# Patient Record
Sex: Male | Born: 1960 | Hispanic: Yes | Marital: Single | Smoking: Never smoker
Health system: Southern US, Community
[De-identification: ages and names within clinical notes are randomized; demographics above are authoritative.]

## PROBLEM LIST (undated history)

## (undated) DIAGNOSIS — F329 Major depressive disorder, single episode, unspecified: Secondary | ICD-10-CM

## (undated) DIAGNOSIS — R51 Headache: Secondary | ICD-10-CM

## (undated) DIAGNOSIS — F32A Depression, unspecified: Secondary | ICD-10-CM

## (undated) DIAGNOSIS — M1990 Unspecified osteoarthritis, unspecified site: Secondary | ICD-10-CM

## (undated) DIAGNOSIS — I10 Essential (primary) hypertension: Secondary | ICD-10-CM

## (undated) DIAGNOSIS — R519 Headache, unspecified: Secondary | ICD-10-CM

## (undated) DIAGNOSIS — R7303 Prediabetes: Secondary | ICD-10-CM

## (undated) HISTORY — PX: NO PAST SURGERIES: SHX2092

---

## 2017-04-17 ENCOUNTER — Emergency Department (HOSPITAL_COMMUNITY)
Admission: EM | Admit: 2017-04-17 | Discharge: 2017-04-17 | Disposition: A | Discharge status: Home / Self Care | Payer: Self-pay | Attending: Emergency Medicine | Admitting: Emergency Medicine

## 2017-04-17 ENCOUNTER — Encounter (HOSPITAL_COMMUNITY): Payer: Self-pay

## 2017-04-17 ENCOUNTER — Emergency Department (HOSPITAL_COMMUNITY): Payer: Self-pay

## 2017-04-17 DIAGNOSIS — M25562 Pain in left knee: Secondary | ICD-10-CM | POA: Insufficient documentation

## 2017-04-17 DIAGNOSIS — M1711 Unilateral primary osteoarthritis, right knee: Secondary | ICD-10-CM | POA: Insufficient documentation

## 2017-04-17 DIAGNOSIS — G8929 Other chronic pain: Secondary | ICD-10-CM | POA: Insufficient documentation

## 2017-04-17 DIAGNOSIS — M25561 Pain in right knee: Secondary | ICD-10-CM | POA: Insufficient documentation

## 2017-04-17 MED ORDER — DICLOFENAC SODIUM 1 % TD GEL: 4.0000 g | Freq: Four times a day (QID) | TRANSDERMAL | 0 refills | Status: DC

## 2017-04-17 MED ORDER — IBUPROFEN 600 MG PO TABS: 600.0000 mg | ORAL_TABLET | Freq: Four times a day (QID) | ORAL | 0 refills | Status: DC | PRN

## 2017-04-17 MED ORDER — IBUPROFEN 400 MG PO TABS
600.0000 mg | ORAL_TABLET | Freq: Once | ORAL | Status: AC
  Administered 2017-04-17: 14:00:00 600 mg via ORAL
  Filled 2017-04-17: qty 1

## 2017-04-17 NOTE — ED Notes (Signed)
Called pt name x3 to be roomed. No response. 

## 2017-04-17 NOTE — ED Provider Notes (Signed)
[facility]-EMERGENCY DEPT Provider Note   CSN: [id number] Arrival date & time: 04/17/17  0848     History   Chief Complaint No chief complaint on file.   HPI Nathaniel Bailey is a 56 y.o. male.  The history is provided by the patient.  Knee Pain   The current episode started more than 1 week ago. The problem occurs constantly. The problem has been gradually worsening. The pain is present in the right knee. The quality of the pain is described as aching and sharp. The pain is moderate. Associated symptoms include limited range of motion and stiffness. Pertinent negatives include no numbness. The symptoms are aggravated by activity. He has tried OTC pain medications for the symptoms. The treatment provided moderate relief. There has been no history of extremity trauma.   56 year old male who presents with bilateral knee pain and lower neck pain x 3 years. Over past few weeks, right knee pain has been worse and primarily the reason he comes to ED for evaluation. He has intermittent swelling, but no redness or fever. Pain worse with walking and ROM of the knee. No fall or injury.    History reviewed. No pertinent past medical history.  There are no active problems to display for this patient.   History reviewed. No pertinent surgical history.     Home Medications    Prior to Admission medications   Medication Sig Start Date End Date Taking? Authorizing Provider  diclofenac sodium (VOLTAREN) 1 % GEL Apply 4 g topically 4 (four) times daily. 04/17/17   Lavera Guise, MD  ibuprofen (ADVIL,MOTRIN) 600 MG tablet Take 1 tablet (600 mg total) by mouth every 6 (six) hours as needed for mild pain or moderate pain. 04/17/17   Lavera Guise, MD    Family History No family history on file.  Social History Social History  Substance Use Topics  . Smoking status: Never Smoker  . Smokeless tobacco: Never Used  . Alcohol use Not on file     Allergies   Patient has no allergy  information on record.   Review of Systems Review of Systems  Constitutional: Negative for fever.  Gastrointestinal: Negative for nausea and vomiting.  Musculoskeletal: Positive for arthralgias and stiffness.  Skin: Negative for wound.  Allergic/Immunologic: Negative for immunocompromised state.  Neurological: Negative for numbness.  Hematological: Does not bruise/bleed easily.     Physical Exam Updated Vital Signs BP (!) 162/89   Pulse 75   Temp 98 F (36.7 C) (Oral)   Resp 18   SpO2 100%   Physical Exam Physical Exam  Constitutional: Appears well-developed and well-nourished. No acute distress. HENT:  Head: Normocephalic.  Eyes: Conjunctivae are normal.  Cardiovascular: Normal rate and intact distal pulses.   +2 DP pulses bilaterally Pulmonary/Chest: Effort normal. No respiratory distress.  Abdominal: Exhibits no distension. no tenderness. Musculoskeletal: Normal range of motion of bilateral knees. No soft tissue swelling, bruising or deformity. Neurological: Alert. Fluent speech. Full strength in bilateral lower extremities. Full sensation in bilateral lower extremities Skin: Skin is warm and dry.  Psychiatric: Normal mood and affect. Behavior is normal.  Nursing note and vitals reviewed.   ED Treatments / Results  Labs (all labs ordered are listed, but only abnormal results are displayed) Labs Reviewed - No data to display  EKG  EKG Interpretation None       Radiology Dg Knee Complete 4 Views Right  Result Date: 04/17/2017 CLINICAL DATA:  Worsening knee pain for  a few months. EXAM: RIGHT KNEE - COMPLETE 4+ VIEW COMPARISON:  None. FINDINGS: No evidence of fracture, dislocation, or joint effusion. Degenerative marginal spurring greatest at the medial compartment. IMPRESSION: 1. Mild degenerative spurring greatest at the medial compartment. 2. No acute or focal finding. Electronically Signed   By: Marnee Spring M.D.   On: 04/17/2017 11:54     Procedures Procedures (including critical care time)  Medications Ordered in ED Medications  ibuprofen (ADVIL,MOTRIN) tablet 600 mg (not administered)     Initial Impression / Assessment and Plan / ED Course  I have reviewed the triage vital signs and the nursing notes.  Pertinent labs & imaging results that were available during my care of the patient were reviewed by me and considered in my medical decision making (see chart for details).     Primarily presents with 3 years now worsening right knee pain and intermittent swelling. Presentation seems consistent with that of likely arthritis. X-ray of the right knee shows evidence of mild arthritis with bone spur. I discussed continued supportive care with anti-inflammatory medications. He requested referral for knee injections, and given contact or orthopedic surgery. Strict return and follow-up instructions reviewed. He expressed understanding of all discharge instructions and felt comfortable with the plan of care.   Final Clinical Impressions(s) / ED Diagnoses   Final diagnoses:  Chronic pain of both knees  Arthritis of right knee    New Prescriptions New Prescriptions   DICLOFENAC SODIUM (VOLTAREN) 1 % GEL    Apply 4 g topically 4 (four) times daily.   IBUPROFEN (ADVIL,MOTRIN) 600 MG TABLET    Take 1 tablet (600 mg total) by mouth every 6 (six) hours as needed for mild pain or moderate pain.     Lavera Guise, MD 04/17/17 [phone]

## 2017-04-17 NOTE — ED Notes (Signed)
Patient transported to X-ray 

## 2017-04-17 NOTE — ED Triage Notes (Signed)
Patient complains of ongoing bilateral knee and posterior neck pain for months, denies trauma. Some relief with advil. NAD

## 2017-04-17 NOTE — Discharge Instructions (Signed)
Please follow-up with orthopedic doctor regarding your arthritis if needed  Take ibuprofen, tylenol and use cream as prescribed for pain  Return for worsening symptoms, including fever, worsening redness/swelling, or any other symptoms concerning to you

## 2017-05-22 ENCOUNTER — Ambulatory Visit (INDEPENDENT_AMBULATORY_CARE_PROVIDER_SITE_OTHER): Payer: Self-pay | Admitting: Physician Assistant

## 2017-05-29 ENCOUNTER — Ambulatory Visit (INDEPENDENT_AMBULATORY_CARE_PROVIDER_SITE_OTHER): Payer: Self-pay | Admitting: Physician Assistant

## 2017-05-29 ENCOUNTER — Encounter (INDEPENDENT_AMBULATORY_CARE_PROVIDER_SITE_OTHER): Payer: Self-pay | Admitting: Physician Assistant

## 2017-05-29 DIAGNOSIS — M25561 Pain in right knee: Secondary | ICD-10-CM

## 2017-05-29 DIAGNOSIS — M722 Plantar fascial fibromatosis: Secondary | ICD-10-CM

## 2017-05-29 MED ORDER — IBUPROFEN 600 MG PO TABS
600.0000 mg | ORAL_TABLET | Freq: Four times a day (QID) | ORAL | 0 refills | Status: DC | PRN
Start: 2017-05-29 — End: 2021-09-29

## 2017-05-29 NOTE — Progress Notes (Signed)
Office Visit Note   Patient: Nathaniel Bailey           Date of Birth: 1961/05/03           MRN: [id number] Visit Date: 05/29/2017              Requested by: No referring provider defined for this encounter. PCP: Patient, No Pcp Per   Assessment & Plan: Visit Diagnoses:  1. Right knee pain, unspecified chronicity   2. Plantar fasciitis, right     Plan: Discussed with him proper shoe wear.  Discussed gastrocsoleus stretching exercises reviewed this with him.  Have him continue the ibuprofen 600 mg 3 times daily.  In regards to the knee seeing back an approximately 2 weeks to check his progress or lack of.  Like for him to make note of any sensation of the knee locking giving way or other mechanical symptoms which are reviewed with him today also how much relief he received from the injection.  In regards to the bruising on the medial aspect of the left ankle discussed with him that this appeared to be just some broken blood vessels and that it is nothing to be concerned about.   Follow-Up Instructions: Return in about 4 weeks (around 06/26/2017).   Orders:  No orders of the defined types were placed in this encounter.  Meds ordered this encounter  Medications  . ibuprofen (ADVIL,MOTRIN) 600 MG tablet    Sig: Take 1 tablet (600 mg total) by mouth every 6 (six) hours as needed for mild pain or moderate pain.    Dispense:  30 tablet    Refill:  0      Procedures: No procedures performed   Clinical Data: No additional findings.   Subjective: Chief Complaint  Patient presents with  . Left Knee - Pain  . Right Knee - Pain  Right heel pain  HPI [name] is a 56 year old Spanish-speaking male who is accompanied by an interpreter today.  Comes in today for bilateral knee pain he states that he had left knee pain 1-2 years ago currently having no pain after having an injection approximately 2 years ago.  He notes that he has  an area of bruising medial aspect of  the left ankle he is concerned about.  He has had no injury.  He is having right knee pain this been ongoing for the past 5 years but has became very painful over the last few months.  He notes that if he flexes his knee way back he has a locking sensation in the knee when further questioned about this there is some concern that there may be a language barrier here despite the interpreter going over what walking truly means he may just feel stiffness after bringing the leg but back to far and when rising from a sitting position.  He states he has been told that his cartilage was gone in his right knee 5 years ago.  He has had no known injury to the knee.  Is also having right heel pain that is worse with first step of the day and at the end of the day after working in a splint about the past 4-5 months.  He has had no injury to the right heel. Personally reviewed radiographs from 04/17/2017 of the right knee and this showed some mild medial compartmental narrowing.  Otherwise no acute fractures.  No effusion.  Knee is well located.  Review of Systems Please see HPI  otherwise review of systems negative  Objective: Vital Signs: There were no vitals taken for this visit.  Physical Exam  Constitutional: He is oriented to person, place, and time. He appears well-developed and well-nourished. No distress.  Cardiovascular: Intact distal pulses.  Pulmonary/Chest: Effort normal.  Neurological: He is alert and oriented to person, place, and time.  Skin: He is not diaphoretic.  Psychiatric: He has a normal mood and affect.    Ortho Exam Bilateral feet good sensation throughout.  No rashes skin lesions ulcerations or impending ulcers.  Dorsal pedal pulses are present bilaterally.  Nontender over the Achilles bilaterally.  Tenderness over the right medial tubercle of the calcaneus.  Tight gastroc on the right nontolerant on the left.  Calves are supple nontender bilaterally.  Left knee full range of motion  without pain.  No instability valgus varus stressing.  Negative McMurray's.  Bilateral knees without effusion, edema or erythema.  Right knee tenderness along the medial lateral joint line.  He has excellent range of motion right knee but with forced flexion he has pain in the knee.  McMurray's is negative bilaterally Specialty Comments:  No specialty comments available.  Imaging: No results found.   PMFS History: There are no active problems to display for this patient.  History reviewed. No pertinent past medical history.  History reviewed. No pertinent family history.  History reviewed. No pertinent surgical history. Social History   Occupational History  . Not on file  Tobacco Use  . Smoking status: Never Smoker  . Smokeless tobacco: Never Used  Substance and Sexual Activity  . Alcohol use: Not on file  . Drug use: Not on file  . Sexual activity: Not on file

## 2017-05-29 NOTE — Progress Notes (Deleted)
Nathaniel Bailey returns today for follow-up of her left knee status post left knee arthroscopy with partial medial meniscectomy and a large bucket-handle tear.  She is having pain in the back of the knee and the anterior aspect of the knee also.  States pain is worse with weightbearing.  Said no fevers chills no shortness of breath chest pain calf pain.  An interpreter she used to speak with patient due to the fact that she only speaks [place].  Left knee: Port sites are healing well no signs of infection left calf supple nontender.  She has full extension and flexion beyond 90 degrees.  There is no abnormal warmth,but she has a slight effusion.    Impression: 1 week status post left knee arthroscopy with medial meniscectomy of a large bucket-handle tear  Plan: Discussed with patient that her knee pain should dissipate with time but due to the fact that she is having significant pain recommended aspiration of the knee and injection with cortisone.  She agrees.  We will see her back in 4 weeks sooner if there is any concerns or questions.  Sutures were removed.  She can wash in the shower and get these of the incisions wet.  She will work on scar tissue mobilization.

## 2017-05-29 NOTE — Progress Notes (Deleted)
   Procedure Note  Patient: Nathaniel Bailey             Date of Birth: [birth year]           MRN: [id number]             Visit Date: 05/29/2017  Procedures: Visit Diagnoses: No diagnosis found.  Large Joint Inj: L knee on 05/29/2017 2:32 PM Indications: pain Details: 22 G 1.5 in needle, superolateral approach  Arthrogram: No  Medications: 40 mg methylPREDNISolone acetate 40 MG/ML; 5 mL lidocaine 1 % Aspirate: 20 mL Outcome: tolerated well, no immediate complications Procedure, treatment alternatives, risks and benefits explained, specific risks discussed. Consent was given by the patient. Immediately prior to procedure a time out was called to verify the correct patient, procedure, equipment, support staff and site/side marked as required. Patient was prepped and draped in the usual sterile fashion.

## 2017-06-13 ENCOUNTER — Ambulatory Visit (INDEPENDENT_AMBULATORY_CARE_PROVIDER_SITE_OTHER): Payer: Self-pay | Admitting: Physician Assistant

## 2017-06-13 ENCOUNTER — Other Ambulatory Visit (INDEPENDENT_AMBULATORY_CARE_PROVIDER_SITE_OTHER): Payer: Self-pay

## 2017-06-13 ENCOUNTER — Encounter (INDEPENDENT_AMBULATORY_CARE_PROVIDER_SITE_OTHER): Payer: Self-pay | Admitting: Physician Assistant

## 2017-06-13 DIAGNOSIS — M25561 Pain in right knee: Secondary | ICD-10-CM

## 2017-06-13 DIAGNOSIS — G8929 Other chronic pain: Secondary | ICD-10-CM

## 2017-06-13 MED ORDER — DICLOFENAC SODIUM 75 MG PO TBEC: 75.0000 mg | DELAYED_RELEASE_TABLET | Freq: Two times a day (BID) | ORAL | 1 refills | Status: DC

## 2017-06-13 NOTE — Progress Notes (Signed)
[name] returns today follow-up of his right knee pain and right plantar fasciitis.  He states that his knee felt better initially after the injection.  However his pain is now returning in the medial aspect of the knee.  He was taking ibuprofen for the pain which helps some but he ran out of the ibuprofen 2 days ago.  In regards to his plantar fasciitis this is much improved with the stretching and anti-inflammatories.  Again he is Spanish speaking and an interpreter accompanies him today.  Physical exam: Right knee has tenderness along medial joint line.  Good range of motion of the knee today.  Slight effusion.  Positive McMurray's.  Impression: Right knee pain  Right plantar fasciitis resolved  Plan: Due to patient's continued pain in the right knee despite time and a cortisone injection recommend MRI to rule out medial meniscal tear.  Have him follow-up after the MRI to go over results and discuss further treatment.  Placed him back on his diclofenac.  Questions were encouraged and answered today at length using the interpreter that was present throughout the entire exam.

## 2017-07-08 ENCOUNTER — Ambulatory Visit
Admission: RE | Admit: 2017-07-08 | Discharge: 2017-07-08 | Disposition: A | Discharge status: Home / Self Care | Payer: No Typology Code available for payment source | Source: Ambulatory Visit | Attending: Physician Assistant | Admitting: Physician Assistant

## 2017-07-08 DIAGNOSIS — M25561 Pain in right knee: Principal | ICD-10-CM

## 2017-07-08 DIAGNOSIS — G8929 Other chronic pain: Secondary | ICD-10-CM

## 2017-09-16 ENCOUNTER — Ambulatory Visit (INDEPENDENT_AMBULATORY_CARE_PROVIDER_SITE_OTHER): Payer: Self-pay | Admitting: Physician Assistant

## 2017-10-07 ENCOUNTER — Encounter (INDEPENDENT_AMBULATORY_CARE_PROVIDER_SITE_OTHER): Payer: Self-pay | Admitting: Physician Assistant

## 2017-10-07 ENCOUNTER — Ambulatory Visit (INDEPENDENT_AMBULATORY_CARE_PROVIDER_SITE_OTHER): Payer: Self-pay | Admitting: Physician Assistant

## 2017-10-07 VITALS — Ht 66.0 in | Wt 165.0 lb

## 2017-10-07 DIAGNOSIS — M23200 Derangement of unspecified lateral meniscus due to old tear or injury, right knee: Secondary | ICD-10-CM

## 2017-10-07 NOTE — Progress Notes (Signed)
   Office Visit Note   Patient: Nathaniel Bailey           Date of Birth: 1960/08/17           MRN: [id number] Visit Date: 10/07/2017              Requested by: No referring provider defined for this encounter. PCP: Patient, No Pcp Per   Assessment & Plan: Visit Diagnoses:  1. Other old tear of lateral meniscus of right knee     Plan: Due to the MRI findings and the fact that the patient continues to have pain and mechanical symptoms of the right knee recommend right knee arthroscopy with partial medial meniscectomy.  Also possible excision of ganglion cyst.  Questions encouraged and answered at length today.  Risk including infection PE DVT all reviewed with the patient.  We will see him back 1 week postop.  Did discuss the knee arthroscopy with the patient at length today using an interpreter to help with communication.  Patient's questions were encouraged and answered by Dr. Magnus IvanBlackman and myself today.  Follow-Up Instructions: Return in about 1 week (around 10/14/2017) for post-op.   Orders:  No orders of the defined types were placed in this encounter.  No orders of the defined types were placed in this encounter.     Procedures: No procedures performed   Clinical Data: No additional findings.   Subjective: Chief Complaint  Patient presents with  . Right Knee - Pain, Follow-up    MRI review    HPI Nathaniel Bailey returns today for follow-up of his right knee status post MRI.  He continues to have pain in medial aspect of the right knee and giving way sensation of the knee.  No catching locking popping.  He underwent an MRI dated 07/08/2017.  This shows a complex tear involving the medial meniscus at the mid body region.  Also a 2.2 x 1.8 ganglion cyst is seen in the central portion of the joint space.Medial compartmental moderate chondromalacia .   Review of Systems See HPI otherwise negative  Objective: Vital Signs: Ht 5\' 6"  (1.676 m)   Wt 165 lb (74.8  kg)   BMI 26.63 kg/m   Physical Exam  Constitutional: He is oriented to person, place, and time. He appears well-developed and well-nourished. No distress.  Pulmonary/Chest: Effort normal.  Neurological: He is alert and oriented to person, place, and time.  Skin: He is not diaphoretic.  Psychiatric: He has a normal mood and affect.    Ortho Exam Right knee no effusion abnormal warmth erythema.  Tenderness along medial joint line.  No instability valgus varus stress. Specialty Comments:  No specialty comments available.  Imaging: No results found.   PMFS History: There are no active problems to display for this patient.  No past medical history on file.  No family history on file.  No past surgical history on file. Social History   Occupational History  . Not on file  Tobacco Use  . Smoking status: Never Smoker  . Smokeless tobacco: Never Used  Substance and Sexual Activity  . Alcohol use: Not on file  . Drug use: Not on file  . Sexual activity: Not on file

## 2017-10-09 ENCOUNTER — Other Ambulatory Visit (INDEPENDENT_AMBULATORY_CARE_PROVIDER_SITE_OTHER): Payer: Self-pay | Admitting: Physician Assistant

## 2017-10-16 ENCOUNTER — Other Ambulatory Visit: Payer: Self-pay

## 2017-10-16 ENCOUNTER — Encounter (HOSPITAL_COMMUNITY): Payer: Self-pay | Admitting: *Deleted

## 2017-10-17 ENCOUNTER — Ambulatory Visit (HOSPITAL_COMMUNITY): Payer: Self-pay | Admitting: Certified Registered Nurse Anesthetist

## 2017-10-17 ENCOUNTER — Encounter (HOSPITAL_COMMUNITY)
Admission: RE | Disposition: A | Discharge status: Home / Self Care | Payer: Self-pay | Source: Ambulatory Visit | Attending: Orthopaedic Surgery

## 2017-10-17 ENCOUNTER — Telehealth (INDEPENDENT_AMBULATORY_CARE_PROVIDER_SITE_OTHER): Payer: Self-pay | Admitting: Orthopaedic Surgery

## 2017-10-17 ENCOUNTER — Ambulatory Visit (HOSPITAL_COMMUNITY)
Admission: RE | Admit: 2017-10-17 | Discharge: 2017-10-17 | Disposition: A | Discharge status: Home / Self Care | Payer: Self-pay | Source: Ambulatory Visit | Attending: Orthopaedic Surgery | Admitting: Orthopaedic Surgery

## 2017-10-17 ENCOUNTER — Encounter (HOSPITAL_COMMUNITY): Payer: Self-pay | Admitting: Anesthesiology

## 2017-10-17 DIAGNOSIS — M94261 Chondromalacia, right knee: Secondary | ICD-10-CM | POA: Insufficient documentation

## 2017-10-17 DIAGNOSIS — Z79899 Other long term (current) drug therapy: Secondary | ICD-10-CM | POA: Insufficient documentation

## 2017-10-17 DIAGNOSIS — M23321 Other meniscus derangements, posterior horn of medial meniscus, right knee: Secondary | ICD-10-CM

## 2017-10-17 DIAGNOSIS — M23221 Derangement of posterior horn of medial meniscus due to old tear or injury, right knee: Secondary | ICD-10-CM | POA: Insufficient documentation

## 2017-10-17 DIAGNOSIS — F329 Major depressive disorder, single episode, unspecified: Secondary | ICD-10-CM | POA: Insufficient documentation

## 2017-10-17 DIAGNOSIS — M25461 Effusion, right knee: Secondary | ICD-10-CM | POA: Insufficient documentation

## 2017-10-17 DIAGNOSIS — M1990 Unspecified osteoarthritis, unspecified site: Secondary | ICD-10-CM | POA: Insufficient documentation

## 2017-10-17 HISTORY — DX: Depression, unspecified: F32.A

## 2017-10-17 HISTORY — DX: Major depressive disorder, single episode, unspecified: F32.9

## 2017-10-17 HISTORY — DX: Essential (primary) hypertension: I10

## 2017-10-17 HISTORY — DX: Prediabetes: R73.03

## 2017-10-17 HISTORY — DX: Headache: R51

## 2017-10-17 HISTORY — PX: KNEE ARTHROSCOPY WITH MEDIAL MENISECTOMY: SHX5651

## 2017-10-17 HISTORY — DX: Headache, unspecified: R51.9

## 2017-10-17 HISTORY — DX: Unspecified osteoarthritis, unspecified site: M19.90

## 2017-10-17 LAB — CBC
HCT: 43.7 % (ref 39.0–52.0)
Hemoglobin: 15 g/dL (ref 13.0–17.0)
MCH: 29.9 pg (ref 26.0–34.0)
MCHC: 34.3 g/dL (ref 30.0–36.0)
MCV: 87.1 fL (ref 78.0–100.0)
PLATELETS: 190 10*3/uL (ref 150–400)
RBC: 5.02 MIL/uL (ref 4.22–5.81)
RDW: 13.4 % (ref 11.5–15.5)
WBC: 5.9 10*3/uL (ref 4.0–10.5)

## 2017-10-17 LAB — BASIC METABOLIC PANEL
Anion gap: 10 (ref 5–15)
BUN: 12 mg/dL (ref 6–20)
CHLORIDE: 107 mmol/L (ref 101–111)
CO2: 24 mmol/L (ref 22–32)
CREATININE: 0.83 mg/dL (ref 0.61–1.24)
Calcium: 9.1 mg/dL (ref 8.9–10.3)
GFR calc non Af Amer: >60 mL/min (ref >60)
Glucose, Bld: 134 mg/dL — ABNORMAL HIGH (ref 65–99)
Potassium: 3.9 mmol/L (ref 3.5–5.1)
SODIUM: 141 mmol/L (ref 135–145)

## 2017-10-17 SURGERY — ARTHROSCOPY, KNEE, WITH MEDIAL MENISCECTOMY
Anesthesia: General | Site: Knee | Laterality: Right

## 2017-10-17 MED ORDER — HYDROCODONE-ACETAMINOPHEN 5-325 MG PO TABS: 1.0000 | ORAL_TABLET | ORAL | 0 refills | Status: DC | PRN

## 2017-10-17 MED ORDER — BUPIVACAINE HCL (PF) 0.5 % IJ SOLN
INTRAMUSCULAR | Status: AC
  Filled 2017-10-17: qty 30

## 2017-10-17 MED ORDER — LIDOCAINE 2% (20 MG/ML) 5 ML SYRINGE
INTRAMUSCULAR | Status: DC | PRN
  Administered 2017-10-17: 100 mg via INTRAVENOUS

## 2017-10-17 MED ORDER — CEFAZOLIN SODIUM-DEXTROSE 2-4 GM/100ML-% IV SOLN
2.0000 g | INTRAVENOUS | Status: AC
  Administered 2017-10-17: 2 g via INTRAVENOUS
  Filled 2017-10-17: qty 100

## 2017-10-17 MED ORDER — LIDOCAINE 2% (20 MG/ML) 5 ML SYRINGE
INTRAMUSCULAR | Status: AC
  Filled 2017-10-17: qty 5

## 2017-10-17 MED ORDER — HYDROCODONE-ACETAMINOPHEN 7.5-325 MG PO TABS
1.0000 | ORAL_TABLET | Freq: Once | ORAL | Status: AC | PRN
  Administered 2017-10-17: 1 via ORAL

## 2017-10-17 MED ORDER — CHLORHEXIDINE GLUCONATE 4 % EX LIQD: 60.0000 mL | Freq: Once | CUTANEOUS | Status: DC

## 2017-10-17 MED ORDER — PROPOFOL 10 MG/ML IV BOLUS
INTRAVENOUS | Status: AC
  Filled 2017-10-17: qty 20

## 2017-10-17 MED ORDER — FENTANYL CITRATE (PF) 250 MCG/5ML IJ SOLN
INTRAMUSCULAR | Status: AC
  Filled 2017-10-17: qty 5

## 2017-10-17 MED ORDER — MORPHINE SULFATE (PF) 4 MG/ML IV SOLN
INTRAVENOUS | Status: AC
  Filled 2017-10-17: qty 1

## 2017-10-17 MED ORDER — HYDROCODONE-ACETAMINOPHEN 7.5-325 MG PO TABS
ORAL_TABLET | ORAL | Status: AC
  Filled 2017-10-17: qty 1

## 2017-10-17 MED ORDER — HYDROMORPHONE HCL 1 MG/ML IJ SOLN: 0.2500 mg | INTRAMUSCULAR | Status: DC | PRN

## 2017-10-17 MED ORDER — BUPIVACAINE HCL (PF) 0.5 % IJ SOLN
INTRAMUSCULAR | Status: DC | PRN
  Administered 2017-10-17: 10 mL
  Administered 2017-10-17: 20 mL

## 2017-10-17 MED ORDER — MIDAZOLAM HCL 5 MG/5ML IJ SOLN
INTRAMUSCULAR | Status: DC | PRN
  Administered 2017-10-17 (×2): 1 mg via INTRAVENOUS

## 2017-10-17 MED ORDER — HYDROCODONE-ACETAMINOPHEN 5-325 MG PO TABS
ORAL_TABLET | ORAL | Status: AC
  Filled 2017-10-17: qty 1

## 2017-10-17 MED ORDER — PROPOFOL 10 MG/ML IV BOLUS
INTRAVENOUS | Status: DC | PRN
  Administered 2017-10-17: 200 mg via INTRAVENOUS

## 2017-10-17 MED ORDER — MORPHINE SULFATE (PF) 4 MG/ML IV SOLN
INTRAVENOUS | Status: DC | PRN
  Administered 2017-10-17: 4 mg

## 2017-10-17 MED ORDER — DEXAMETHASONE SODIUM PHOSPHATE 10 MG/ML IJ SOLN
INTRAMUSCULAR | Status: DC | PRN
  Administered 2017-10-17: 10 mg via INTRAVENOUS

## 2017-10-17 MED ORDER — DEXAMETHASONE SODIUM PHOSPHATE 10 MG/ML IJ SOLN
INTRAMUSCULAR | Status: AC
  Filled 2017-10-17: qty 1

## 2017-10-17 MED ORDER — MIDAZOLAM HCL 2 MG/2ML IJ SOLN
INTRAMUSCULAR | Status: AC
Start: 2017-10-17 — End: ?
  Filled 2017-10-17: qty 2

## 2017-10-17 MED ORDER — ONDANSETRON HCL 4 MG/2ML IJ SOLN
INTRAMUSCULAR | Status: AC
  Filled 2017-10-17: qty 2

## 2017-10-17 MED ORDER — STERILE WATER FOR IRRIGATION IR SOLN
Status: DC | PRN
  Administered 2017-10-17: 500 mL

## 2017-10-17 MED ORDER — ONDANSETRON HCL 4 MG/2ML IJ SOLN
INTRAMUSCULAR | Status: DC | PRN
  Administered 2017-10-17: 4 mg via INTRAVENOUS

## 2017-10-17 MED ORDER — LACTATED RINGERS IV SOLN
INTRAVENOUS | Status: DC
  Administered 2017-10-17: 1000 mL via INTRAVENOUS

## 2017-10-17 MED ORDER — FENTANYL CITRATE (PF) 100 MCG/2ML IJ SOLN
INTRAMUSCULAR | Status: DC | PRN
  Administered 2017-10-17 (×2): 50 ug via INTRAVENOUS

## 2017-10-17 MED ORDER — LACTATED RINGERS IR SOLN
Status: DC | PRN
  Administered 2017-10-17: 6000 mL

## 2017-10-17 MED ORDER — MEPERIDINE HCL 50 MG/ML IJ SOLN: 6.2500 mg | INTRAMUSCULAR | Status: DC | PRN

## 2017-10-17 MED ORDER — PROMETHAZINE HCL 25 MG/ML IJ SOLN: 6.2500 mg | INTRAMUSCULAR | Status: DC | PRN

## 2017-10-17 SURGICAL SUPPLY — 32 items
BANDAGE ACE 6X5 VEL STRL LF (GAUZE/BANDAGES/DRESSINGS) ×3 IMPLANT
BLADE CUDA SHAVER 3.5 (BLADE) ×3 IMPLANT
COVER SURGICAL LIGHT HANDLE (MISCELLANEOUS) ×3 IMPLANT
DRAPE U-SHAPE 47X51 STRL (DRAPES) ×3 IMPLANT
DURAPREP 26ML APPLICATOR (WOUND CARE) ×3 IMPLANT
GAUZE SPONGE 4X4 12PLY STRL (GAUZE/BANDAGES/DRESSINGS) ×3 IMPLANT
GAUZE XEROFORM 1X8 LF (GAUZE/BANDAGES/DRESSINGS) ×3 IMPLANT
GLOVE BIO SURGEON STRL SZ7.5 (GLOVE) ×3 IMPLANT
GLOVE BIOGEL PI IND STRL 6.5 (GLOVE) ×1 IMPLANT
GLOVE BIOGEL PI IND STRL 7.5 (GLOVE) ×1 IMPLANT
GLOVE BIOGEL PI IND STRL 8 (GLOVE) ×2 IMPLANT
GLOVE BIOGEL PI INDICATOR 6.5 (GLOVE) ×2
GLOVE BIOGEL PI INDICATOR 7.5 (GLOVE) ×2
GLOVE BIOGEL PI INDICATOR 8 (GLOVE) ×4
GLOVE ECLIPSE 7.5 STRL STRAW (GLOVE) ×3 IMPLANT
GLOVE ECLIPSE 8.0 STRL XLNG CF (GLOVE) ×3 IMPLANT
GLOVE SKINSENSE NS SZ7.5 (GLOVE) ×2
GLOVE SKINSENSE STRL SZ7.5 (GLOVE) ×1 IMPLANT
GLOVE SURG SS PI 6.5 STRL IVOR (GLOVE) ×3 IMPLANT
GOWN STRL REUS W/TWL LRG LVL3 (GOWN DISPOSABLE) ×3 IMPLANT
GOWN STRL REUS W/TWL XL LVL3 (GOWN DISPOSABLE) ×9 IMPLANT
KIT BASIN OR (CUSTOM PROCEDURE TRAY) ×3 IMPLANT
MANIFOLD NEPTUNE II (INSTRUMENTS) ×3 IMPLANT
PACK ARTHROSCOPY WL (CUSTOM PROCEDURE TRAY) ×3 IMPLANT
PAD ABD 8X10 STRL (GAUZE/BANDAGES/DRESSINGS) ×6 IMPLANT
PADDING CAST COTTON 6X4 STRL (CAST SUPPLIES) ×3 IMPLANT
POSITIONER SURGICAL ARM (MISCELLANEOUS) ×3 IMPLANT
SUT ETHILON 4 0 PS 2 18 (SUTURE) ×3 IMPLANT
SYR CONTROL 10ML LL (SYRINGE) ×3 IMPLANT
TOWEL OR 17X26 10 PK STRL BLUE (TOWEL DISPOSABLE) ×3 IMPLANT
TUBING ARTHRO INFLOW-ONLY STRL (TUBING) ×3 IMPLANT
WRAP KNEE MAXI GEL POST OP (GAUZE/BANDAGES/DRESSINGS) ×3 IMPLANT

## 2017-10-17 NOTE — Anesthesia Procedure Notes (Signed)
Procedure Name: LMA Insertion Date/Time: 10/17/2017 10:38 AM Performed by: Orest DikesPeters, Cheyanna Strick J, CRNA Pre-anesthesia Checklist: Patient identified, Emergency Drugs available, Suction available and Patient being monitored Patient Re-evaluated:Patient Re-evaluated prior to induction Oxygen Delivery Method: Circle system utilized Preoxygenation: Pre-oxygenation with 100% oxygen Induction Type: IV induction LMA: LMA inserted LMA Size: 5.0 Number of attempts: 1 Placement Confirmation: positive ETCO2 and breath sounds checked- equal and bilateral Tube secured with: Tape Dental Injury: Teeth and Oropharynx as per pre-operative assessment

## 2017-10-17 NOTE — H&P (Signed)
Nathaniel Bailey is an 57 y.o. male.   Chief Complaint:   Right knee pain with locking at catching HPI:   57 yo male with a history of chronic right knee pain with swelling as well as locking and catching.  A recent MRI confirms a complex medial meniscal tear.   Surgery has been recommended at this point given his continued pain and mechanical symptoms.  Past Medical History:  Diagnosis Date  . Arthritis   . Depression   . Headache   . Hypertension    NO MEDS , patient reports sometimes elevated   . Pre-diabetes    ? few years back doctor mentioned "sugar may have been a little elevated"     Past Surgical History:  Procedure Laterality Date  . NO PAST SURGERIES      History reviewed. No pertinent family history. Social History:  reports that he has never smoked. He has never used smokeless tobacco. He reports that he drinks alcohol. He reports that he does not use drugs.  Allergies: No Known Allergies  Medications Prior to Admission  Medication Sig Dispense Refill  . Multiple Vitamin (MULTIVITAMIN) tablet Take 1 tablet by mouth daily.    . diclofenac (VOLTAREN) 75 MG EC tablet Take 1 tablet (75 mg total) by mouth 2 (two) times daily. (Patient not taking: Reported on 10/07/2017) 60 tablet 1  . ibuprofen (ADVIL,MOTRIN) 600 MG tablet Take 1 tablet (600 mg total) by mouth every 6 (six) hours as needed for mild pain or moderate pain. (Patient not taking: Reported on 10/07/2017) 30 tablet 0    Results for orders placed or performed during the hospital encounter of 10/17/17 (from the past 48 hour(s))  Basic metabolic panel     Status: Abnormal   Collection Time: 10/17/17  9:20 AM  Result Value Ref Range   Sodium 141 135 - 145 mmol/L   Potassium 3.9 3.5 - 5.1 mmol/L   Chloride 107 101 - 111 mmol/L   CO2 24 22 - 32 mmol/L   Glucose, Bld 134 (H) 65 - 99 mg/dL   BUN 12 6 - 20 mg/dL   Creatinine, Ser 0.83 0.61 - 1.24 mg/dL   Calcium 9.1 8.9 - 10.3 mg/dL   GFR calc non Af Amer >60  >60 mL/min   GFR calc Af Amer >60 >60 mL/min    Comment: (NOTE) The eGFR has been calculated using the CKD EPI equation. This calculation has not been validated in all clinical situations. eGFR's persistently <60 mL/min signify possible Chronic Kidney Disease.    Anion gap 10 5 - 15    Comment: Performed at [facility] [place] [address] [place], [place] [address]  CBC     Status: None   Collection Time: 10/17/17  9:20 AM  Result Value Ref Range   WBC 5.9 4.0 - 10.5 K/uL   RBC 5.02 4.22 - 5.81 MIL/uL   Hemoglobin 15.0 13.0 - 17.0 g/dL   HCT 43.7 39.0 - 52.0 %   MCV 87.1 78.0 - 100.0 fL   MCH 29.9 26.0 - 34.0 pg   MCHC 34.3 30.0 - 36.0 g/dL   RDW 13.4 11.5 - 15.5 %   Platelets 190 150 - 400 K/uL    Comment: Performed at [facility], [place] [address], [place], [place] [address]   No results found.  Review of Systems  Musculoskeletal: Positive for joint pain.  All other systems reviewed and are negative.   Pulse 66, temperature  98.8 F (37.1 C), temperature source Oral, resp. rate 18, height 5' 6"  (1.676 m), weight 172 lb (78 kg), SpO2 99 %. Physical Exam  Constitutional: He is oriented to person, place, and time. He appears well-developed and well-nourished.  HENT:  Head: Normocephalic and atraumatic.  Eyes: Pupils are equal, round, and reactive to light. EOM are normal.  Neck: Normal range of motion.  Cardiovascular: Normal rate and regular rhythm.  Respiratory: Effort normal and breath sounds normal.  GI: Soft. Bowel sounds are normal.  Musculoskeletal:       Right knee: He exhibits effusion, abnormal alignment and abnormal meniscus. Tenderness found. Medial joint line tenderness noted.  Neurological: He is alert and oriented to person, place, and time.  Skin: Skin is warm and dry.  Psychiatric: He has a normal mood and affect.     Assessment/Plan Right knee with symptomatic medial meniscal tear  1)  To the OR today for a right  knee arthroscopy with a partial medial meniscectomy.  This will be done as an outpatient.  Risks and benefits discussed and informed consent is obtained.  Mcarthur Rossetti, MD 10/17/2017, 10:15 AM

## 2017-10-17 NOTE — Anesthesia Postprocedure Evaluation (Signed)
Anesthesia Post Note  Patient: Nathaniel Bailey  Procedure(s) Performed: RIGHT KNEE ARTHROSCOPY WITH PARTIAL MEDIAL MENISCECTOMY AND DEBRIDEMENT (Right Knee)     Patient location during evaluation: PACU Anesthesia Type: General Level of consciousness: awake and alert and oriented Pain management: pain level controlled Vital Signs Assessment: post-procedure vital signs reviewed and stable Respiratory status: spontaneous breathing, nonlabored ventilation and respiratory function stable Cardiovascular status: blood pressure returned to baseline and stable Postop Assessment: no apparent nausea or vomiting Anesthetic complications: no    Last Vitals:  Vitals:   10/17/17 1145 10/17/17 1200  BP: 133/78   Pulse: 61   Resp: 12 (P) 16  Temp:    SpO2: 100%     Last Pain:  Vitals:   10/17/17 1145  TempSrc:   PainSc: Asleep                 Bay Jarquin A.

## 2017-10-17 NOTE — Progress Notes (Signed)
Pt's care complete. Pt is waiting for ride from [place]. Pt voices no concern at this time.

## 2017-10-17 NOTE — Discharge Instructions (Addendum)
Anestesia general en los adultos, cuidados posteriores (General Anesthesia, Adult, Care After) Estas indicaciones le proporcionan informacin acerca de cmo deber cuidarse despus del procedimiento. El mdico tambin podr darle instrucciones ms especficas. El tratamiento ha sido planificado segn las prcticas mdicas actuales, pero en algunos casos pueden ocurrir problemas. Comunquese con el mdico si tiene algn problema o dudas despus del procedimiento. QU ESPERAR DESPUS DEL PROCEDIMIENTO Despus del procedimiento, es comn DIRECTVtener los siguientes sntomas:  Vmitos.  Dolor de Advertising copywritergarganta.  Lentitud mental. Es normal sentir lo siguiente:  Nuseas.  Fro o escalofros.  Somnolencia.  Cansancio.  Dolor o inflamacin, incluso en partes del cuerpo no afectadas por la ciruga. INSTRUCCIONES PARA EL CUIDADO EN EL HOGAR Durante al menos 24horas despus del procedimiento:  No haga lo siguiente: ? Participar en actividades que impliquen posibles cadas o lesiones. ? Conducir vehculos. ? Operar maquinarias pesadas. ? Beber alcohol. ? Tomar somnferos o medicamentos que causen somnolencia. ? Firmar documentos legales ni tomar Teachers Insurance and Annuity Associationdecisiones importantes. ? Cuidar a nios por su cuenta.  Hacer reposo. Comida y bebida  Si vomita, tome agua, jugo o sopa una vez que pueda beber sin vomitar.  Beba suficiente lquido para Photographermantener la orina clara o de color amarillo plido.  Asegrese de no tener nuseas antes de ingerir alimentos slidos.  Siga la dieta recomendada por el mdico. Instrucciones generales  Permanezca con un adulto responsable hasta que est completamente despierto y consciente.  Retome sus actividades normales como se lo haya indicado el mdico. Pregntele al mdico qu actividades son seguras para usted.  Tome los medicamentos de venta libre y los recetados solamente como se lo haya indicado el mdico.  Si fuma, no lo haga sin supervisin.  Concurra a todas las  visitas de control como se lo haya indicado el mdico. Esto es importante. SOLICITE ATENCIN MDICA SI:  Contina con nuseas o vmitos en su casa, y los medicamentos no ayudan.  No puede beber lquidos ni volver a comer.  No puede orinar despus de 8 a 12horas.  Tiene una erupcin cutnea.  Tiene fiebre.  Tiene cada vez ms enrojecimiento en la zona de la ciruga. SOLICITE ATENCIN MDICA DE INMEDIATO SI:  Tiene dificultad para respirar.  Siente dolor en el pecho.  Tiene una hemorragia imprevista.  Siente que tiene un problema potencialmente mortal o urgente. Esta informacin no tiene Theme park managercomo fin reemplazar el consejo del mdico. Asegrese de hacerle al mdico cualquier pregunta que tenga. Document Released: 06/25/2005 Document Revised: 07/16/2014 Document Reviewed: 06/09/2015 Elsevier Interactive Patient Education  2018 [employer] You may put all of your weight on your right leg and knee as comfort allows. Ice and elevation as needed for swelling. You may remove your dressings tomorrow 10/18/17 and get your incisions wet in the shower. After each shower, place band-aids over your incisions daily. Increase your activities as comfort allows.  General Anesthesia, Adult, Care After These instructions provide you with information about caring for yourself after your procedure. Your health care provider may also give you more specific instructions. Your treatment has been planned according to current medical practices, but problems sometimes occur. Call your health care provider if you have any problems or questions after your procedure. What can I expect after the procedure? After the procedure, it is common to have:  Vomiting.  A sore throat.  Mental slowness.  It is common to feel:  Nauseous.  Cold or shivery.  Sleepy.  Tired.  Sore or achy, even in parts of your body where  you did not have surgery.  Follow these instructions at home: For at least 24 hours  after the procedure:  Do not: ? Participate in activities where you could fall or become injured. ? Drive. ? Use heavy machinery. ? Drink alcohol. ? Take sleeping pills or medicines that cause drowsiness. ? Make important decisions or sign legal documents. ? Take care of children on your own.  Rest. Eating and drinking  If you vomit, drink water, juice, or soup when you can drink without vomiting.  Drink enough fluid to keep your urine clear or pale yellow.  Make sure you have little or no nausea before eating solid foods.  Follow the diet recommended by your health care provider. General instructions  Have a responsible adult stay with you until you are awake and alert.  Return to your normal activities as told by your health care provider. Ask your health care provider what activities are safe for you.  Take over-the-counter and prescription medicines only as told by your health care provider.  If you smoke, do not smoke without supervision.  Keep all follow-up visits as told by your health care provider. This is important. Contact a health care provider if:  You continue to have nausea or vomiting at home, and medicines are not helpful.  You cannot drink fluids or start eating again.  You cannot urinate after 8-12 hours.  You develop a skin rash.  You have fever.  You have increasing redness at the site of your procedure. Get help right away if:  You have difficulty breathing.  You have chest pain.  You have unexpected bleeding.  You feel that you are having a life-threatening or urgent problem. This information is not intended to replace advice given to you by your health care provider. Make sure you discuss any questions you have with your health care provider. Document Released: 10/01/2000 Document Revised: 11/28/2015 Document Reviewed: 06/09/2015 Elsevier Interactive Patient Education  [employer].

## 2017-10-17 NOTE — Brief Op Note (Signed)
10/17/2017  11:14 AM  PATIENT:  Nathaniel Bailey  57 y.o. male  PRE-OPERATIVE DIAGNOSIS:  right knee meniscal tear  POST-OPERATIVE DIAGNOSIS:  right knee meniscal tear  PROCEDURE:  Procedure(s): RIGHT KNEE ARTHROSCOPY WITH PARTIAL MEDIAL MENISCECTOMY AND DEBRIDEMENT (Right)  SURGEON:  Surgeon(s) and Role:    Kathryne Hitch* Navjot Pilgrim Y, MD - Primary  PHYSICIAN ASSISTANT: Rexene EdisonGil Clark, PA-C assisted  ANESTHESIA:   local and general  EBL:  10 mL   COUNTS:  YES  DICTATION: .Other Dictation: Dictation Number [id number]  PLAN OF CARE: Discharge to home after PACU  PATIENT DISPOSITION:  PACU - hemodynamically stable.   Delay start of Pharmacological VTE agent (>24hrs) due to surgical blood loss or risk of bleeding: yes

## 2017-10-17 NOTE — Telephone Encounter (Signed)
Please advise 

## 2017-10-17 NOTE — Telephone Encounter (Signed)
Judie GrieveBryan with [facility] [place] called left voicemail message with question concerning Rx for Norco 5-325. Judie GrieveBryan advised patient is a hospital patient. Judie GrieveBryan said the Rx according to the stop act was written over the 50 morphine milliequivalent limit. Judie GrieveBryan asked if the Rx can be changed to 1 every 4 hours? The number to contact Judie GrieveBryan is [phone]

## 2017-10-17 NOTE — Anesthesia Preprocedure Evaluation (Addendum)
Anesthesia Evaluation  Patient identified by MRN, date of birth, ID band Patient awake    Reviewed: Allergy & Precautions, NPO status , Patient's Chart, lab work & pertinent test results  Airway Mallampati: II  TM Distance: >3 FB Neck ROM: Full    Dental  (+) Teeth Intact, Caps   Pulmonary neg pulmonary ROS,    Pulmonary exam normal breath sounds clear to auscultation       Cardiovascular hypertension, Normal cardiovascular exam Rhythm:Regular Rate:Normal     Neuro/Psych  Headaches, PSYCHIATRIC DISORDERS Depression    GI/Hepatic negative GI ROS, Neg liver ROS,   Endo/Other  negative endocrine ROS  Renal/GU negative Renal ROS  negative genitourinary   Musculoskeletal  (+) Arthritis , Osteoarthritis,  Torn medial meniscus right knee   Abdominal   Peds  Hematology negative hematology ROS (+)   Anesthesia Other Findings   Reproductive/Obstetrics                            Anesthesia Physical Anesthesia Plan  ASA: II  Anesthesia Plan: General   Post-op Pain Management:    Induction: Intravenous  PONV Risk Score and Plan: Midazolam, Dexamethasone, Ondansetron and Treatment may vary due to age or medical condition  Airway Management Planned: LMA  Additional Equipment:   Intra-op Plan:   Post-operative Plan: Extubation in OR  Informed Consent: I have reviewed the patients History and Physical, chart, labs and discussed the procedure including the risks, benefits and alternatives for the proposed anesthesia with the patient or authorized representative who has indicated his/her understanding and acceptance.   Dental advisory given  Plan Discussed with: Anesthesiologist, CRNA and Surgeon  Anesthesia Plan Comments:         Anesthesia Quick Evaluation

## 2017-10-17 NOTE — Transfer of Care (Signed)
Immediate Anesthesia Transfer of Care Note  Patient: Nathaniel Bailey  Procedure(s) Performed: RIGHT KNEE ARTHROSCOPY WITH PARTIAL MEDIAL MENISCECTOMY AND DEBRIDEMENT (Right Knee)  Patient Location: PACU  Anesthesia Type:General  Level of Consciousness: awake, alert  and oriented  Airway & Oxygen Therapy: Patient Spontanous Breathing and Patient connected to face mask oxygen  Post-op Assessment: Report given to RN and Post -op Vital signs reviewed and stable  Post vital signs: Reviewed and stable  Last Vitals:  Vitals Value Taken Time  BP    Temp    Pulse 61 10/17/2017 11:24 AM  Resp 11 10/17/2017 11:24 AM  SpO2 97 % 10/17/2017 11:24 AM  Vitals shown include unvalidated device data.  Last Pain:  Vitals:   10/17/17 0913  TempSrc:   PainSc: 0-No pain      Patients Stated Pain Goal: 4 (10/17/17 0913)  Complications: No apparent anesthesia complications

## 2017-10-17 NOTE — Telephone Encounter (Signed)
I escribed in a new script

## 2017-10-18 NOTE — Op Note (Signed)
NAME:  Nathaniel Bailey, Nathaniel Bailey        ACCOUNT NO.:  MEDICAL RECORD NO.:  [id number]  LOCATION:                                 FACILITY:  PHYSICIAN:  Vanita PandaChristopher Y. Magnus IvanBlackman, M.D.DATE OF BIRTH:  DATE OF PROCEDURE:  10/17/2017 DATE OF DISCHARGE:                              OPERATIVE REPORT   PREOPERATIVE DIAGNOSIS:  Right knee complex medial meniscal tear.  POSTOPERATIVE DIAGNOSIS:  Right knee complex medial meniscal tear.  PROCEDURE:  Right knee arthroscopy with partial medial meniscectomy.  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  SURGEON:  Vanita PandaChristopher Y. Magnus IvanBlackman, M.D.  ASSISTANT:  Richardean CanalGilbert Clark, PA-C.  ANESTHESIA: 1. General. 2. Local with mixture of plain Marcaine, plain lidocaine and morphine.  BLOOD LOSS:  Minimal.  COMPLICATIONS:  None.  INDICATIONS:  The patient is a very pleasant and active 57 year old gentleman, who is not obese.  He does have varus malalignment of his right knee.  He has had significant medial joint line tenderness with developing locking and catching.  He has had a recurrent effusion as well.  An MRI was obtained that showed a complex medial meniscal tear and thinning of the articular cartilage.  At this point, he has tried activity modification, but the knee still locks on quite a bit given the meniscal tear.  We have tried steroid injections as well.  At this point, he does wish to proceed with an arthroscopic intervention.  We talked with him through the interpreter and explained in detail with the surgery involves including a thorough discussion of the risks and benefits of the surgery.  PROCEDURE DESCRIPTION:  After informed consent was obtained, appropriate right knee was marked.  He was brought to the operating room, placed supine on the operating table.  General anesthesia was then obtained. His right  knee was prepped and draped from the thigh down the ankle with DuraPrep and sterile drapes including a sterile stockinette with the bed raised and a lateral leg post utilized, the right knee was flexed off the side of the table.  He was identified the correct patient and correct right knee.  We then made an anterolateral arthroscopy portal, inserted a cannula in the knee.  We went to the medial compartment and made an anteromedial incision and found a large complex medial meniscal tear.  Using straight and up-cutting biters as well as an arthroscopic shaver, we performed a partial medial meniscectomy.  We did find a wide area of cartilage loss on the medial femoral condyle, and using arthroscopic shaver, we performed a chondroplasty on the medial femoral condyle.  This was the same in the trochlear groove.  His ACL and PCL were intact and the knee in a figure 4 position showed the lateral compartment intact.  We then allowed fluid to lavage through the knee and drained all fluid from the knee.  We closed these portal sites with interrupted nylon suture.  We inserted a mixture of morphine and Marcaine and lidocaine into the knee and the portal sites.  Well-padded sterile dressing was applied.  He was awakened, extubated and taken to the recovery room  in stable condition.  All final counts were correct. There were no complications noted.  Postoperatively, we will let him increase his activities as comfort allows, and he will be discharged to home from the recovery room.  We will see him back in the office in a week.     Vanita Panda. Magnus Ivan, M.D.     CYB/MEDQ  D:  10/17/2017  T:  10/17/2017  Job:  [id number]

## 2017-10-28 ENCOUNTER — Ambulatory Visit (INDEPENDENT_AMBULATORY_CARE_PROVIDER_SITE_OTHER): Payer: Self-pay | Admitting: Orthopaedic Surgery

## 2017-10-28 ENCOUNTER — Encounter (INDEPENDENT_AMBULATORY_CARE_PROVIDER_SITE_OTHER): Payer: Self-pay | Admitting: Orthopaedic Surgery

## 2017-10-28 VITALS — Ht 66.0 in | Wt 172.0 lb

## 2017-10-28 DIAGNOSIS — Z9889 Other specified postprocedural states: Secondary | ICD-10-CM | POA: Insufficient documentation

## 2017-10-28 DIAGNOSIS — M23321 Other meniscus derangements, posterior horn of medial meniscus, right knee: Secondary | ICD-10-CM

## 2017-10-28 MED ORDER — HYDROCODONE-ACETAMINOPHEN 5-325 MG PO TABS: 1.0000 | ORAL_TABLET | Freq: Four times a day (QID) | ORAL | 0 refills | Status: DC | PRN

## 2017-10-28 NOTE — Progress Notes (Signed)
The patient is now 11 days status post a right knee arthroscopy.  He has an interpreter with him today.  We performed a partial medial meniscectomy.  He did have significant cartilage changes with arthritis of the medial compartment of his knee.  He comes in today having some swelling.  He does need a refill on his pain medication.  On exam his right knee does have a moderate effusion with pain on flexion extension.  His calf is soft.  There is no evidence of infection.  I remove the sutures.  Thru the interpreter I let him know what he is sedated try to get his knee bending and moving and hopefully stronger.  I gave him a prescription for refill of Norco.  We will see him back in about 3 weeks to see if we need to aspirate his knee and place a steroid injection if needed.

## 2017-11-18 ENCOUNTER — Other Ambulatory Visit (INDEPENDENT_AMBULATORY_CARE_PROVIDER_SITE_OTHER): Payer: Self-pay | Admitting: Orthopaedic Surgery

## 2017-11-18 ENCOUNTER — Encounter (INDEPENDENT_AMBULATORY_CARE_PROVIDER_SITE_OTHER): Payer: Self-pay | Admitting: Orthopaedic Surgery

## 2017-11-18 ENCOUNTER — Telehealth (INDEPENDENT_AMBULATORY_CARE_PROVIDER_SITE_OTHER): Payer: Self-pay | Admitting: Orthopaedic Surgery

## 2017-11-18 ENCOUNTER — Ambulatory Visit (INDEPENDENT_AMBULATORY_CARE_PROVIDER_SITE_OTHER): Payer: Self-pay | Admitting: Orthopaedic Surgery

## 2017-11-18 DIAGNOSIS — M23321 Other meniscus derangements, posterior horn of medial meniscus, right knee: Secondary | ICD-10-CM

## 2017-11-18 DIAGNOSIS — Z9889 Other specified postprocedural states: Secondary | ICD-10-CM

## 2017-11-18 MED ORDER — LIDOCAINE HCL 1 % IJ SOLN
3.0000 mL | INTRAMUSCULAR | Status: AC | PRN
  Administered 2017-11-18: 3 mL

## 2017-11-18 MED ORDER — TRAMADOL HCL 50 MG PO TABS: 50.0000 mg | ORAL_TABLET | Freq: Four times a day (QID) | ORAL | 0 refills | Status: DC | PRN

## 2017-11-18 MED ORDER — METHYLPREDNISOLONE ACETATE 40 MG/ML IJ SUSP
40.0000 mg | INTRAMUSCULAR | Status: AC | PRN
  Administered 2017-11-18: 40 mg via INTRA_ARTICULAR

## 2017-11-18 NOTE — Telephone Encounter (Signed)
Juliette Alcide called from [facility] needing the tramadol RX to be sent in again to the pharmacy. CB # [phone] [facility] [address] in [place]

## 2017-11-18 NOTE — Telephone Encounter (Signed)
I escribed it in again

## 2017-11-18 NOTE — Telephone Encounter (Signed)
See below

## 2017-11-18 NOTE — Progress Notes (Signed)
Office Visit Note   Patient: Nathaniel Bailey           Date of Birth: Mar 26, 1961           MRN: [id number] Visit Date: 11/18/2017              Requested by: No referring provider defined for this encounter. PCP: Patient, No Pcp Per   Assessment & Plan: Visit Diagnoses:  1. Medial meniscus, posterior horn derangement, right   2. Status post arthroscopy of right knee     Plan: I do feel that he should continue work on bending his knee and quad strengthening exercises.  Through the interpreter I talked about trying a steroid injection he agrees with this.  He tolerated well.  We will see him back in a month see how is doing overall.  I did send in some tramadol for him.  Follow-Up Instructions: Return in about 1 month (around 12/16/2017).   Orders:  No orders of the defined types were placed in this encounter.  No orders of the defined types were placed in this encounter.     Procedures: Large Joint Inj: R knee on 11/18/2017 10:17 AM Indications: diagnostic evaluation and pain Details: 22 G 1.5 in needle, superolateral approach  Arthrogram: No  Medications: 3 mL lidocaine 1 %; 40 mg methylPREDNISolone acetate 40 MG/ML Outcome: tolerated well, no immediate complications Procedure, treatment alternatives, risks and benefits explained, specific risks discussed. Consent was given by the patient. Immediately prior to procedure a time out was called to verify the correct patient, procedure, equipment, support staff and site/side marked as required. Patient was prepped and draped in the usual sterile fashion.       Clinical Data: No additional findings.   Subjective: Chief Complaint  Patient presents with  . Right Knee - Follow-up, Routine Post Op  The patient is continue to follow-up after having a right knee arthroscopy with partial medial meniscectomy of a significant large meniscal tear.  He is only 57 years old and does have varus malalignment of the knee.  He  examined with a cane.  He has an interpreter with him today as well.  He still having a lot of pain in his knee and understands we want to try a steroid injection in today to see if this would help.  He is been working on bending and moving going to increase his activities just as he can.  He uses a cane in his opposite hand. HPI  Review of Systems He denies any fever, chills, nausea, vomiting.  Objective: Vital Signs: There were no vitals taken for this visit.  Physical Exam He is alert and oriented x3 and in no acute distress Ortho Exam Examination of his right knee shows well-healed surgical incisions.  He does have varus malalignment but much better range of motion overall and no significant effusion. Specialty Comments:  No specialty comments available.  Imaging: No results found.   PMFS History: Patient Active Problem List   Diagnosis Date Noted  . Status post arthroscopy of right knee 10/28/2017  . Medial meniscus, posterior horn derangement, right 10/17/2017   Past Medical History:  Diagnosis Date  . Arthritis   . Depression   . Headache   . Hypertension    NO MEDS , patient reports sometimes elevated   . Pre-diabetes    ? few years back doctor mentioned "sugar may have been a little elevated"     History reviewed. No pertinent family history.  Past Surgical History:  Procedure Laterality Date  . KNEE ARTHROSCOPY WITH MEDIAL MENISECTOMY Right 10/17/2017   Procedure: RIGHT KNEE ARTHROSCOPY WITH PARTIAL MEDIAL MENISCECTOMY AND DEBRIDEMENT;  Surgeon: Kathryne Hitch, MD;  Location: [facility];  Service: Orthopedics;  Laterality: Right;  . NO PAST SURGERIES     Social History   Occupational History  . Not on file  Tobacco Use  . Smoking status: Never Smoker  . Smokeless tobacco: Never Used  Substance and Sexual Activity  . Alcohol use: Yes    Frequency: Never    Comment: SOCIAL   . Drug use: Never  . Sexual activity: Not on file

## 2017-12-16 ENCOUNTER — Encounter (INDEPENDENT_AMBULATORY_CARE_PROVIDER_SITE_OTHER): Payer: Self-pay | Admitting: Physician Assistant

## 2017-12-16 ENCOUNTER — Ambulatory Visit (INDEPENDENT_AMBULATORY_CARE_PROVIDER_SITE_OTHER): Payer: Self-pay | Admitting: Physician Assistant

## 2017-12-16 DIAGNOSIS — Z9889 Other specified postprocedural states: Secondary | ICD-10-CM

## 2017-12-16 MED ORDER — DICLOFENAC SODIUM 75 MG PO TBEC: 75.0000 mg | DELAYED_RELEASE_TABLET | Freq: Two times a day (BID) | ORAL | 2 refills | Status: DC

## 2017-12-16 NOTE — Progress Notes (Signed)
Office Visit Note   Patient: Nathaniel Bailey           Date of Birth: Jun 16, 1961           MRN: [id number] Visit Date: 12/16/2017              Requested by: No referring provider defined for this encounter. PCP: Patient, No Pcp Per   Assessment & Plan: Visit Diagnoses:  1. Status post arthroscopy of right knee     Plan:  Discussed with the patient through the interpreter today the he needs to work on quad strengthening.  We will place him back on diclofenac 75 mg twice daily.  He will follow-up with [place] in 1 month check his progress.  I told him it may take 6 months to a year until he get a knee arthroscopy.  Again I did review his knee arthroscopy photos with him again and showed him the areas of grade 3 changes involving the medial femoral condyle. Shoewear discussed with him at length he is to avoid open back shoes.  Gastrocsoleus stretching exercises discussed. Follow-Up Instructions: Return in about 1 month (around 01/13/2018).   Orders:  No orders of the defined types were placed in this encounter.  Meds ordered this encounter  Medications  . diclofenac (VOLTAREN) 75 MG EC tablet    Sig: Take 1 tablet (75 mg total) by mouth 2 (two) times daily.    Dispense:  90 tablet    Refill:  2      Procedures: No procedures performed   Clinical Data: No additional findings.   Subjective: Chief Complaint  Patient presents with  . Right Knee - Routine Post Op    HPI  Jos returns today for follow-up of his right knee.  He had a cortisone injection on 11/18/2017 which gave him no real relief.  States is not having pain with him preoperatively just having pain medial anterior aspect of the knee now.  Is also having some right heel pain.  He is taking Tylenol for pain at this point.  No NSAIDs. Review of Systems See HPI  Objective: Vital Signs: There were no vitals taken for this visit.  Physical Exam  Constitutional: He is oriented to person, place, and time. He  appears well-developed and well-nourished. No distress.  Pulmonary/Chest: Effort normal.  Neurological: He is alert and oriented to person, place, and time.  Skin: He is not diaphoretic.  Psychiatric: He has a normal mood and affect.    Ortho Exam Right knee good range of motion.  Port sites well-healed.  Tenderness along the anterior medial aspect of the knee.  No effusion abnormal warmth.  Right gastroc tight on exam.  Tenderness over the medial tubercle of calcaneus right foot.  No rashes skin lesions ulcerations impending ulcers of the right. Specialty Comments:  No specialty comments available.  Imaging: No results found.   PMFS History: Patient Active Problem List   Diagnosis Date Noted  . Status post arthroscopy of right knee 10/28/2017  . Medial meniscus, posterior horn derangement, right 10/17/2017   Past Medical History:  Diagnosis Date  . Arthritis   . Depression   . Headache   . Hypertension    NO MEDS , patient reports sometimes elevated   . Pre-diabetes    ? few years back doctor mentioned "sugar may have been a little elevated"     No family history on file.  Past Surgical History:  Procedure Laterality Date  .  KNEE ARTHROSCOPY WITH MEDIAL MENISECTOMY Right 10/17/2017   Procedure: RIGHT KNEE ARTHROSCOPY WITH PARTIAL MEDIAL MENISCECTOMY AND DEBRIDEMENT;  Surgeon: Kathryne HitchBlackman, Christopher Y, MD;  Location: [facility];  Service: Orthopedics;  Laterality: Right;  . NO PAST SURGERIES     Social History   Occupational History  . Not on file  Tobacco Use  . Smoking status: Never Smoker  . Smokeless tobacco: Never Used  Substance and Sexual Activity  . Alcohol use: Yes    Frequency: Never    Comment: SOCIAL   . Drug use: Never  . Sexual activity: Not on file

## 2018-05-26 ENCOUNTER — Other Ambulatory Visit (INDEPENDENT_AMBULATORY_CARE_PROVIDER_SITE_OTHER): Payer: Self-pay | Admitting: Physician Assistant

## 2018-07-25 ENCOUNTER — Other Ambulatory Visit (INDEPENDENT_AMBULATORY_CARE_PROVIDER_SITE_OTHER): Payer: Self-pay | Admitting: Orthopaedic Surgery

## 2018-07-25 NOTE — Telephone Encounter (Signed)
Ok for refill? 

## 2019-05-11 IMAGING — CR DG KNEE COMPLETE 4+V*R*
4 series · 4 of 4 positions shown · non-contrast
Comparison: None.

CLINICAL DATA: Worsening knee pain for a few months.

EXAM:
RIGHT KNEE - COMPLETE 4+ VIEW

[knee ap]
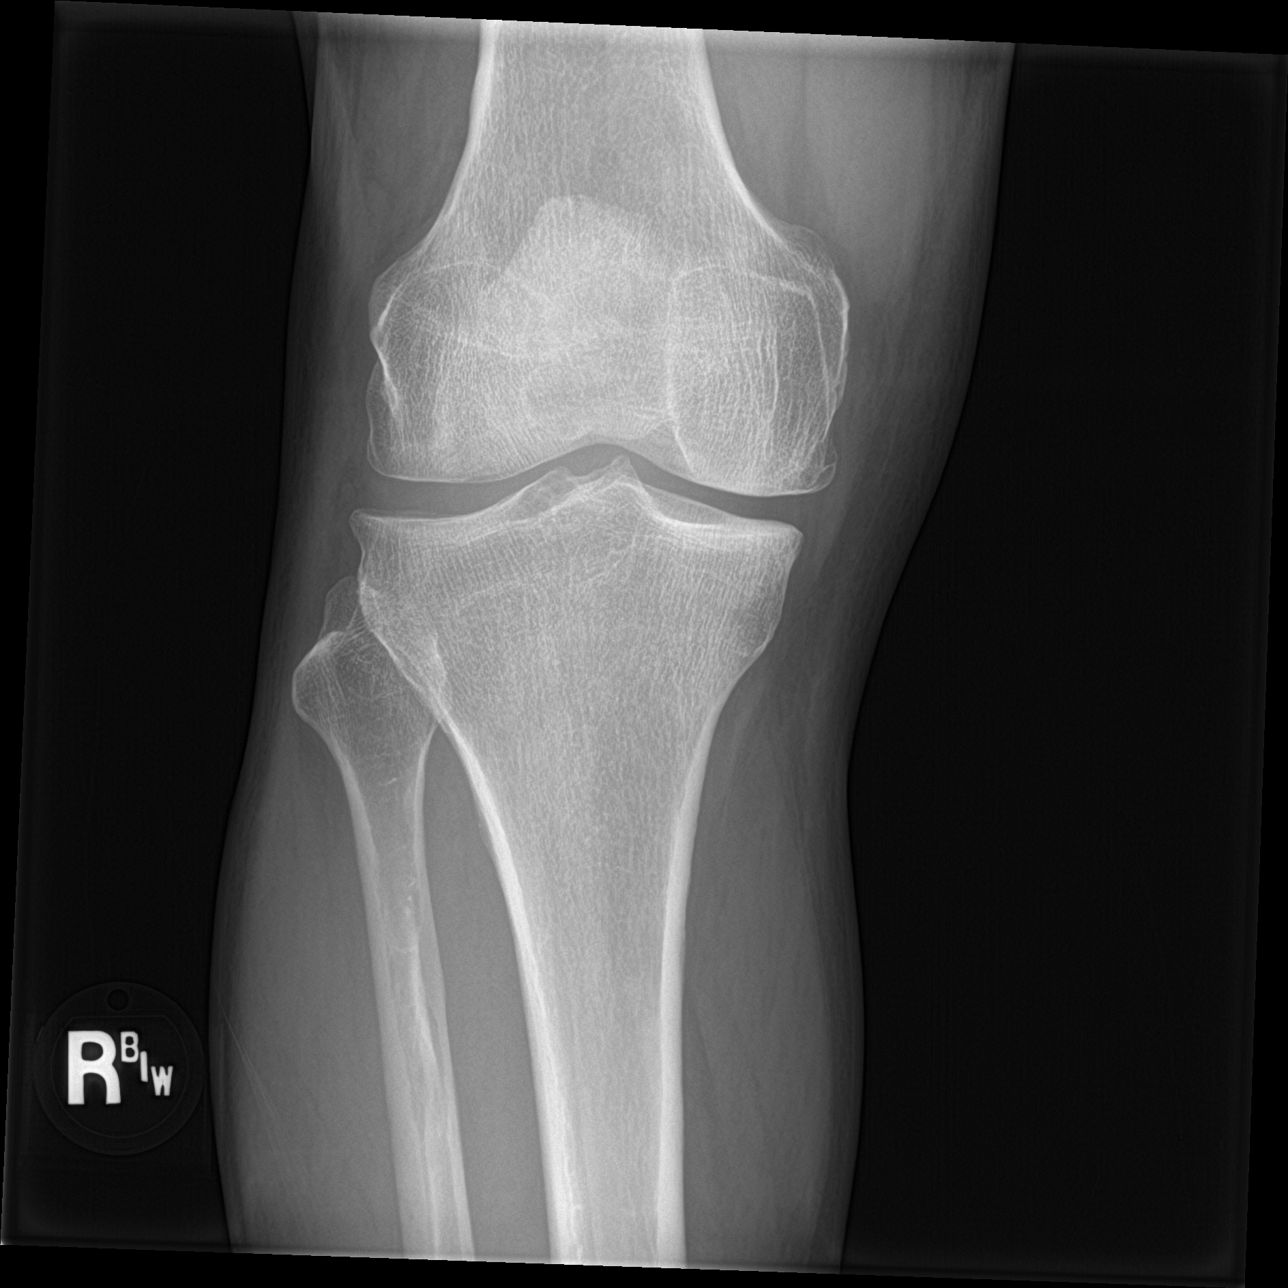

[knee lat]
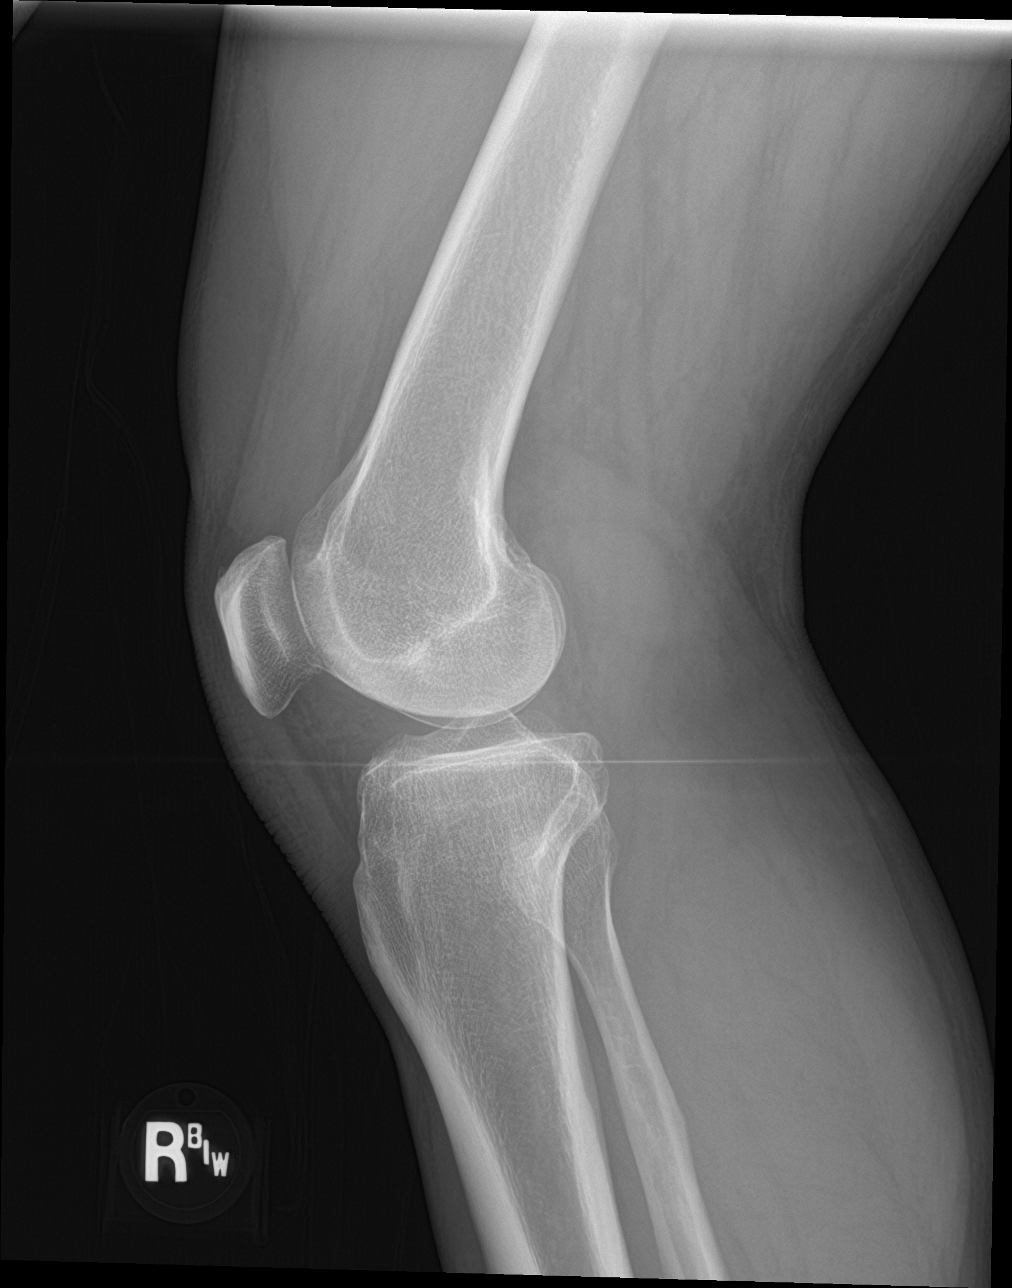

[knee obl (1 of 2)]
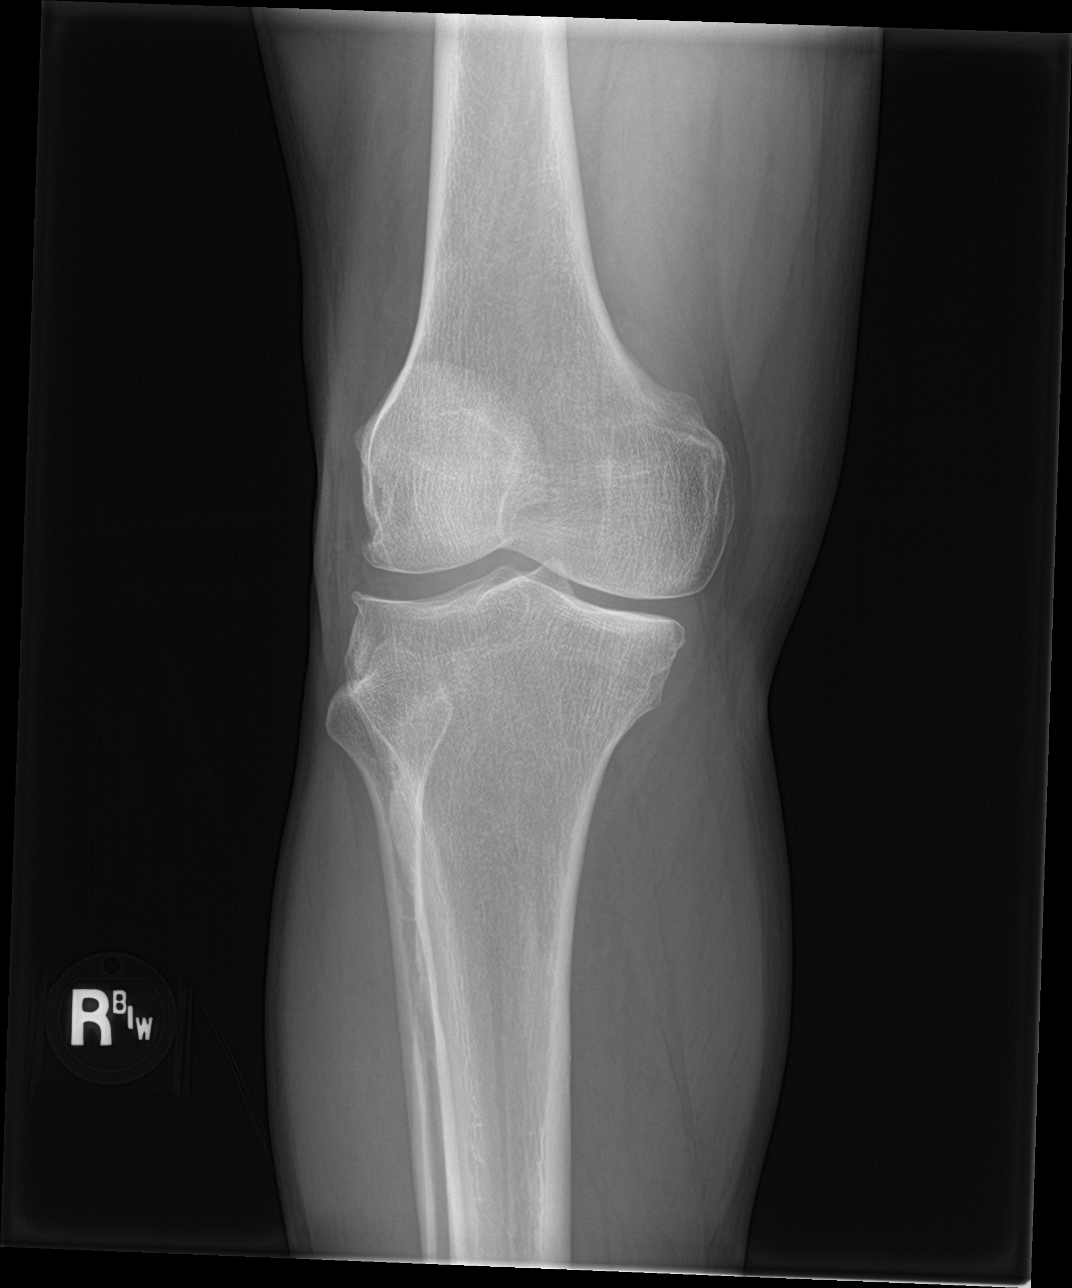

[knee obl (2 of 2)]
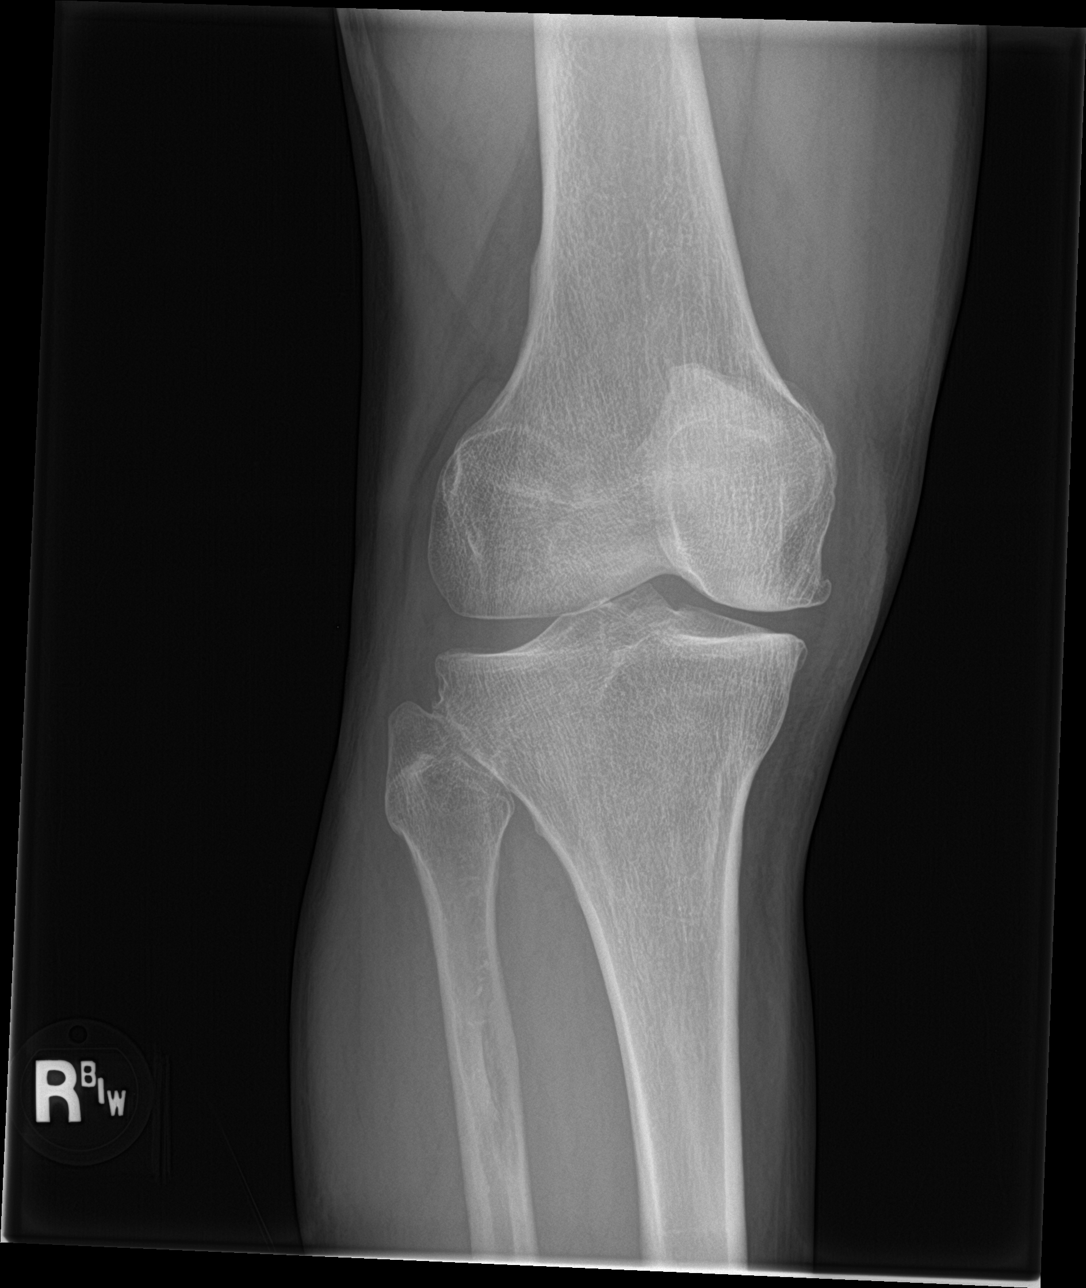

[4 of 4 positions shown; findings below may reference images not displayed]

FINDINGS: No evidence of fracture, dislocation, or joint effusion.
Degenerative marginal spurring greatest at the medial compartment.
IMPRESSION: 1. Mild degenerative spurring greatest at the medial compartment.
2. No acute or focal finding.

## 2019-07-15 ENCOUNTER — Other Ambulatory Visit: Payer: Self-pay

## 2019-07-15 ENCOUNTER — Encounter: Payer: Self-pay | Admitting: Orthopaedic Surgery

## 2019-07-15 ENCOUNTER — Ambulatory Visit (INDEPENDENT_AMBULATORY_CARE_PROVIDER_SITE_OTHER): Payer: Self-pay | Admitting: Orthopaedic Surgery

## 2019-07-15 ENCOUNTER — Ambulatory Visit (INDEPENDENT_AMBULATORY_CARE_PROVIDER_SITE_OTHER): Payer: Self-pay

## 2019-07-15 DIAGNOSIS — M25561 Pain in right knee: Secondary | ICD-10-CM

## 2019-07-15 DIAGNOSIS — M1711 Unilateral primary osteoarthritis, right knee: Secondary | ICD-10-CM

## 2019-07-15 DIAGNOSIS — Z9889 Other specified postprocedural states: Secondary | ICD-10-CM

## 2019-07-15 DIAGNOSIS — G8929 Other chronic pain: Secondary | ICD-10-CM

## 2019-07-15 MED ORDER — METHYLPREDNISOLONE ACETATE 40 MG/ML IJ SUSP
40.0000 mg | INTRAMUSCULAR | Status: AC | PRN
Start: 2019-07-15 — End: 2019-07-15
  Administered 2019-07-15: 14:00:00 40 mg via INTRA_ARTICULAR

## 2019-07-15 MED ORDER — NABUMETONE 750 MG PO TABS: 750.0000 mg | ORAL_TABLET | Freq: Two times a day (BID) | ORAL | 1 refills | Status: DC | PRN

## 2019-07-15 MED ORDER — LIDOCAINE HCL 1 % IJ SOLN
3.0000 mL | INTRAMUSCULAR | Status: AC | PRN
  Administered 2019-07-15: 14:00:00 3 mL

## 2019-07-15 NOTE — Progress Notes (Signed)
Office Visit Note   Patient: Nathaniel Bailey           Date of Birth: 26-Dec-1960           MRN: [id number] Visit Date: 07/15/2019              Requested by: No referring provider defined for this encounter. PCP: Patient, No Pcp Per   Assessment & Plan: Visit Diagnoses:  1. Chronic pain of right knee   2. Status post arthroscopy of right knee   3. Unilateral primary osteoarthritis, right knee     Plan: He certainly does have moderate arthritis in his right knee.  I did recommend a steroid injection in his knee today.  He is a candidate for hyaluronic acid however he has no health insurance to this would be too expensive for him to try.  Of talk to him through the interpreter about activity modification and quad strengthening exercises.  We talked about trying a steroid injection and the risk and benefits of this which he tolerated well today.  I will send in some Relafen as an anti-inflammatory.  All question concerns were answered and addressed.  We can see him back in 3 months to consider repeat injection of a steroid if needed.  There is really nothing else to offer right now other than that given his lack of health insurance he would not be able to go to physical therapy or have a hyaluronic acid injection.  Follow-Up Instructions: Return in about 3 months (around 10/13/2019).   Orders:  Orders Placed This Encounter  Procedures  . Large Joint Inj  . XR Knee 1-2 Views Right   Meds ordered this encounter  Medications  . nabumetone (RELAFEN) 750 MG tablet    Sig: Take 1 tablet (750 mg total) by mouth 2 (two) times daily as needed.    Dispense:  60 tablet    Refill:  1      Procedures: Large Joint Inj: R knee on 07/15/2019 1:30 PM Indications: diagnostic evaluation and pain Details: 22 G 1.5 in needle, superolateral approach  Arthrogram: No  Medications: 3 mL lidocaine 1 %; 40 mg methylPREDNISolone acetate 40 MG/ML Outcome: tolerated well, no immediate  complications Procedure, treatment alternatives, risks and benefits explained, specific risks discussed. Consent was given by the patient. Immediately prior to procedure a time out was called to verify the correct patient, procedure, equipment, support staff and site/side marked as required. Patient was prepped and draped in the usual sterile fashion.       Clinical Data: No additional findings.   Subjective: Chief Complaint  Patient presents with  . Right Knee - Pain  The patient is a very pleasant 59 year old non-English-speaking individual who we performed an arthroscopic intervention on his right knee around 20 months ago.  He had a medial meniscal tear as well as degenerative changes in the medial compartment of his knee.  He is having swelling in his right knee.  He is here with an interpreter today.  He has been having problems with bending and straightening his knee and has been worsening since October.  He also has pain in the back of his knee.  He says he cannot sit or too long without pain in the knee.  He does ambulate using a cane in his opposite hand.  HPI  Review of Systems He currently denies any headache, chest pain, shortness of breath, fever, chills, nausea, vomiting  Objective: Vital Signs: There were no vitals  taken for this visit.  Physical Exam Is alert and orient x3 and in no acute distress Ortho Exam Examination of his right knee shows varus malalignment.  There is medial joint line tenderness but good range of motion.  There are some patellofemoral crepitation.  There is no effusion but there is pain in the posterior medial aspect of his knee. Specialty Comments:  No specialty comments available.  Imaging: XR Knee 1-2 Views Right  Result Date: 07/15/2019 2 views of the right knee show no acute findings.  There is varus malalignment with medial joint space narrowing and patellofemoral arthritic changes.    PMFS History: Patient Active Problem List    Diagnosis Date Noted  . Status post arthroscopy of right knee 10/28/2017  . Medial meniscus, posterior horn derangement, right 10/17/2017   Past Medical History:  Diagnosis Date  . Arthritis   . Depression   . Headache   . Hypertension    NO MEDS , patient reports sometimes elevated   . Pre-diabetes    ? few years back doctor mentioned "sugar may have been a little elevated"     History reviewed. No pertinent family history.  Past Surgical History:  Procedure Laterality Date  . KNEE ARTHROSCOPY WITH MEDIAL MENISECTOMY Right 10/17/2017   Procedure: RIGHT KNEE ARTHROSCOPY WITH PARTIAL MEDIAL MENISCECTOMY AND DEBRIDEMENT;  Surgeon: Mcarthur Rossetti, MD;  Location: [facility];  Service: Orthopedics;  Laterality: Right;  . NO PAST SURGERIES     Social History   Occupational History  . Not on file  Tobacco Use  . Smoking status: Never Smoker  . Smokeless tobacco: Never Used  Substance and Sexual Activity  . Alcohol use: Yes    Comment: SOCIAL   . Drug use: Never  . Sexual activity: Not on file

## 2019-09-29 ENCOUNTER — Other Ambulatory Visit: Payer: Self-pay | Admitting: Orthopaedic Surgery

## 2019-09-29 NOTE — Telephone Encounter (Signed)
Please advise 

## 2019-10-13 ENCOUNTER — Ambulatory Visit (INDEPENDENT_AMBULATORY_CARE_PROVIDER_SITE_OTHER): Payer: Self-pay | Admitting: Orthopaedic Surgery

## 2019-10-13 ENCOUNTER — Other Ambulatory Visit: Payer: Self-pay

## 2019-10-13 ENCOUNTER — Encounter: Payer: Self-pay | Admitting: Orthopaedic Surgery

## 2019-10-13 DIAGNOSIS — Z9889 Other specified postprocedural states: Secondary | ICD-10-CM

## 2019-10-13 DIAGNOSIS — M1711 Unilateral primary osteoarthritis, right knee: Secondary | ICD-10-CM

## 2019-10-13 DIAGNOSIS — M25561 Pain in right knee: Secondary | ICD-10-CM

## 2019-10-13 DIAGNOSIS — G8929 Other chronic pain: Secondary | ICD-10-CM

## 2019-10-13 MED ORDER — LIDOCAINE HCL 1 % IJ SOLN
3.0000 mL | INTRAMUSCULAR | Status: AC | PRN
  Administered 2019-10-13: 3 mL

## 2019-10-13 MED ORDER — NABUMETONE 750 MG PO TABS: 750.0000 mg | ORAL_TABLET | Freq: Two times a day (BID) | ORAL | 3 refills | Status: DC | PRN

## 2019-10-13 MED ORDER — METHYLPREDNISOLONE ACETATE 40 MG/ML IJ SUSP
40.0000 mg | INTRAMUSCULAR | Status: AC | PRN
Start: 2019-10-13 — End: 2019-10-13
  Administered 2019-10-13: 40 mg via INTRA_ARTICULAR

## 2019-10-13 NOTE — Progress Notes (Signed)
Office Visit Note   Patient: Nathaniel Bailey           Date of Birth: 1961/01/01           MRN: [id number] Visit Date: 10/13/2019              Requested by: No referring provider defined for this encounter. PCP: Patient, No Pcp Per   Assessment & Plan: Visit Diagnoses:  1. Chronic pain of right knee   2. Status post arthroscopy of right knee   3. Unilateral primary osteoarthritis, right knee     Plan: In order to temporize his pain I did recommend a steroid injection in his right knee today and he agreed with this and tolerated it well.  We did discuss knee replacement surgery showing him a knee model and explained in detail what the surgery involves.  He will continue work on activity modification and quad strengthening exercises.  I did refill his Relafen.  He will contact [facility] for financial assistance given the fact that he has no health insurance at all and this is surgery we cannot set up until he has some type of assistance per [facility] policy.  He understands this as well he will work on that.  All question concerns were answered and addressed.  Follow-up is as needed otherwise.  Follow-Up Instructions: Return if symptoms worsen or fail to improve.   Orders:  Orders Placed This Encounter  Procedures  . Large Joint Inj   No orders of the defined types were placed in this encounter.     Procedures: Large Joint Inj: R knee on 10/13/2019 8:21 AM Indications: diagnostic evaluation and pain Details: 22 G 1.5 in needle, superolateral approach  Arthrogram: No  Medications: 3 mL lidocaine 1 %; 40 mg methylPREDNISolone acetate 40 MG/ML Outcome: tolerated well, no immediate complications Procedure, treatment alternatives, risks and benefits explained, specific risks discussed. Consent was given by the patient. Immediately prior to procedure a time out was called to verify the correct patient, procedure, equipment, support staff and site/side marked as required.  Patient was prepped and draped in the usual sterile fashion.       Clinical Data: No additional findings.   Subjective: Chief Complaint  Patient presents with  . Right Knee - Follow-up  The patient comes in today with right knee pain.  He has known osteoarthritis of the right knee.  He is only 59 years old.  He is non-English-speaking and has an interpreter with him today.  We have performed an arthroscopic intervention in the past.  He has worsening medial compartment arthritic changes and patellofemoral changes.  At this point he has had occasional steroid injection in his knee.  He is also on Relafen as an anti-inflammatory.  He wants to discuss what other treatment options there are including knee replacement surgery.  There is an interpreter with him today as well.  HPI  Review of Systems He currently denies any headache, chest pain, shortness of breath, fever, chills, nausea, vomiting  Objective: Vital Signs: There were no vitals taken for this visit.  Physical Exam Is alert and orient x3 and in no acute distress Ortho Exam Examination of his right knee shows varus malalignment that is not correctable.  There is significant medial joint tenderness and patellofemoral crepitation.  The knee is ligamentously stable with good range of motion but is painful throughout its arc of motion. Specialty Comments:  No specialty comments available.  Imaging: No results found.  PMFS History: Patient Active Problem List   Diagnosis Date Noted  . Status post arthroscopy of right knee 10/28/2017  . Medial meniscus, posterior horn derangement, right 10/17/2017   Past Medical History:  Diagnosis Date  . Arthritis   . Depression   . Headache   . Hypertension    NO MEDS , patient reports sometimes elevated   . Pre-diabetes    ? few years back doctor mentioned "sugar may have been a little elevated"     History reviewed. No pertinent family history.  Past Surgical History:    Procedure Laterality Date  . KNEE ARTHROSCOPY WITH MEDIAL MENISECTOMY Right 10/17/2017   Procedure: RIGHT KNEE ARTHROSCOPY WITH PARTIAL MEDIAL MENISCECTOMY AND DEBRIDEMENT;  Surgeon: Mcarthur Rossetti, MD;  Location: [facility];  Service: Orthopedics;  Laterality: Right;  . NO PAST SURGERIES     Social History   Occupational History  . Not on file  Tobacco Use  . Smoking status: Never Smoker  . Smokeless tobacco: Never Used  Substance and Sexual Activity  . Alcohol use: Yes    Comment: SOCIAL   . Drug use: Never  . Sexual activity: Not on file

## 2019-11-28 ENCOUNTER — Other Ambulatory Visit: Payer: Self-pay | Admitting: Orthopaedic Surgery

## 2019-12-30 ENCOUNTER — Ambulatory Visit (INDEPENDENT_AMBULATORY_CARE_PROVIDER_SITE_OTHER): Payer: Self-pay | Admitting: Orthopaedic Surgery

## 2019-12-30 ENCOUNTER — Ambulatory Visit (INDEPENDENT_AMBULATORY_CARE_PROVIDER_SITE_OTHER): Payer: Self-pay

## 2019-12-30 ENCOUNTER — Encounter: Payer: Self-pay | Admitting: Orthopaedic Surgery

## 2019-12-30 ENCOUNTER — Other Ambulatory Visit: Payer: Self-pay

## 2019-12-30 DIAGNOSIS — M25562 Pain in left knee: Secondary | ICD-10-CM

## 2019-12-30 DIAGNOSIS — M25561 Pain in right knee: Secondary | ICD-10-CM

## 2019-12-30 DIAGNOSIS — G8929 Other chronic pain: Secondary | ICD-10-CM

## 2019-12-30 MED ORDER — LIDOCAINE HCL 1 % IJ SOLN
3.0000 mL | INTRAMUSCULAR | Status: AC | PRN
  Administered 2019-12-30: 3 mL

## 2019-12-30 MED ORDER — METHYLPREDNISOLONE ACETATE 40 MG/ML IJ SUSP
40.0000 mg | INTRAMUSCULAR | Status: AC | PRN
  Administered 2019-12-30: 40 mg via INTRA_ARTICULAR

## 2019-12-30 NOTE — Progress Notes (Signed)
Office Visit Note   Patient: Nathaniel Bailey           Date of Birth: April 09, 1961           MRN: [id number] Visit Date: 12/30/2019              Requested by: No referring provider defined for this encounter. PCP: Patient, No Pcp Per   Assessment & Plan: Visit Diagnoses:  1. Left knee pain, unspecified chronicity   2. Chronic pain of right knee     Plan: I spoke to the patient in length through the interpreter about trying an aspiration of his left knee with a steroid injection in both knees today.  He agrees with this treatment plan.  The risk and benefits of injections were explained in detail.  I am going to have him continue his Relafen as well.  Surprisingly there was not much fluid aspirated from his left knee at all.  He did tolerate the injections.  We will see him back in 4 weeks to see how he is doing overall.  His left knee as needed I would certainly obtain an MRI if he continues to have pain with posterior medial tenderness as well as locking catching.  We will see how he looks in 4 weeks.  He will continue his Relafen as well as needed.  Follow-Up Instructions: Return in about 4 weeks (around 01/27/2020).   Orders:  Orders Placed This Encounter  Procedures  . Large Joint Inj  . Large Joint Inj  . XR Knee 1-2 Views Left   No orders of the defined types were placed in this encounter.     Procedures: Large Joint Inj: L knee on 12/30/2019 8:47 AM Indications: diagnostic evaluation and pain Details: 22 G 1.5 in needle, superolateral approach  Arthrogram: No  Medications: 3 mL lidocaine 1 %; 40 mg methylPREDNISolone acetate 40 MG/ML Outcome: tolerated well, no immediate complications Procedure, treatment alternatives, risks and benefits explained, specific risks discussed. Consent was given by the patient. Immediately prior to procedure a time out was called to verify the correct patient, procedure, equipment, support staff and site/side marked as required.  Patient was prepped and draped in the usual sterile fashion.   Large Joint Inj: R knee on 12/30/2019 8:47 AM Indications: diagnostic evaluation and pain Details: 22 G 1.5 in needle, superolateral approach  Arthrogram: No  Medications: 3 mL lidocaine 1 %; 40 mg methylPREDNISolone acetate 40 MG/ML Outcome: tolerated well, no immediate complications Procedure, treatment alternatives, risks and benefits explained, specific risks discussed. Consent was given by the patient. Immediately prior to procedure a time out was called to verify the correct patient, procedure, equipment, support staff and site/side marked as required. Patient was prepped and draped in the usual sterile fashion.       Clinical Data: No additional findings.   Subjective: Chief Complaint  Patient presents with  . Left Knee - Pain  The patient is well-known to me.  He is a non-English speaking gentleman who does have an interpreter with him.  We have performed an arthroscopic intervention on his right knee in the past.  He is only 59 years old.  At that time he had a significant meniscal tear but also significant thinning of the cartilage on the medial aspect of his right knee.  He comes in today with left knee pain and swelling.  Hurts in the back of his knee.  He says it feels similar to what it did when  he was dealing with his right knee.  He does take Relafen twice daily and it takes the edge off of his pain.  He does ambulate using a cane.  He has had no other acute change in medical status other than worsening left knee pain and some right knee pain.  HPI  Review of Systems He currently denies any headache, chest pain, shortness of breath, fever, chills, nausea, vomiting  Objective: Vital Signs: There were no vitals taken for this visit.  Physical Exam He is alert and orient x3 and in no acute distress Ortho Exam Examination of his left knee does show just a slight effusion.  He has more neutral alignment of  the left knee than the right knee that has slight varus malalignment.  He has a full arc of motion of his left and right knee.  The left knee hurts on the posterior medial aspect and definitely posteriorly.  It does feel ligamentously stable. Specialty Comments:  No specialty comments available.  Imaging: XR Knee 1-2 Views Left  Result Date: 12/30/2019 2 views of the left knee show no acute findings.  The medial lateral compartments are well-maintained and there is no significant malalignment.  There is narrowing of the patellofemoral joint.    PMFS History: Patient Active Problem List   Diagnosis Date Noted  . Status post arthroscopy of right knee 10/28/2017  . Medial meniscus, posterior horn derangement, right 10/17/2017   Past Medical History:  Diagnosis Date  . Arthritis   . Depression   . Headache   . Hypertension    NO MEDS , patient reports sometimes elevated   . Pre-diabetes    ? few years back doctor mentioned "sugar may have been a little elevated"     History reviewed. No pertinent family history.  Past Surgical History:  Procedure Laterality Date  . KNEE ARTHROSCOPY WITH MEDIAL MENISECTOMY Right 10/17/2017   Procedure: RIGHT KNEE ARTHROSCOPY WITH PARTIAL MEDIAL MENISCECTOMY AND DEBRIDEMENT;  Surgeon: Kathryne Hitch, MD;  Location: [facility];  Service: Orthopedics;  Laterality: Right;  . NO PAST SURGERIES     Social History   Occupational History  . Not on file  Tobacco Use  . Smoking status: Never Smoker  . Smokeless tobacco: Never Used  Vaping Use  . Vaping Use: Never used  Substance and Sexual Activity  . Alcohol use: Yes    Comment: SOCIAL   . Drug use: Never  . Sexual activity: Not on file

## 2020-01-27 ENCOUNTER — Encounter: Payer: Self-pay | Admitting: Physician Assistant

## 2020-01-27 ENCOUNTER — Ambulatory Visit (INDEPENDENT_AMBULATORY_CARE_PROVIDER_SITE_OTHER): Payer: Self-pay | Admitting: Physician Assistant

## 2020-01-27 VITALS — Ht 66.0 in | Wt 172.0 lb

## 2020-01-27 DIAGNOSIS — M25562 Pain in left knee: Secondary | ICD-10-CM

## 2020-01-27 MED ORDER — NABUMETONE 750 MG PO TABS: 750.0000 mg | ORAL_TABLET | Freq: Two times a day (BID) | ORAL | 1 refills | Status: DC | PRN

## 2020-01-27 NOTE — Progress Notes (Signed)
Office Visit Note   Patient: Nathaniel Bailey           Date of Birth: May 21, 1961           MRN: [id number] Visit Date: 01/27/2020              Requested by: No referring provider defined for this encounter. PCP: Patient, No Pcp Per   Assessment & Plan: Visit Diagnoses:  1. Left knee pain, unspecified chronicity     Plan: We will obtain an MRI of his left knee to rule out meniscal tear.  Follow-up after the MRI to go over results discuss further treatment.  He will continue to take his Relafen for pain.  Questions were encouraged and answered.  Interpreter was used throughout the office visit due to the fact patient speaks Spanish.  Follow-Up Instructions: Return MRI.   Orders:  No orders of the defined types were placed in this encounter.  Meds ordered this encounter  Medications  . DISCONTD: nabumetone (RELAFEN) 750 MG tablet    Sig: Take 1 tablet (750 mg total) by mouth 2 (two) times daily as needed.    Dispense:  60 tablet    Refill:  1  . nabumetone (RELAFEN) 750 MG tablet    Sig: Take 1 tablet (750 mg total) by mouth 2 (two) times daily as needed.    Dispense:  60 tablet    Refill:  1      Procedures: No procedures performed   Clinical Data: No additional findings.   Subjective: Chief Complaint  Patient presents with  . Left Knee - Follow-up  . Right Knee - Follow-up    HPI Who is a is well-known to Dr. Magnus Ivan service comes in today for follow-up of his left knee.  He continues to have pain in the knee and has painful popping within the knee.  Most of the pain is in the back of his knee.  History of right knee meniscal tear and significant thinning of the cartilage in the medial aspect of his knee.  He has been taking Relafen for the pain.  States the injection helped some but did not completely alleviate his pain mechanical symptoms.  He is unable to sleep well due to the knee pain.  None English speaking Spanish interpreters used Review of  Systems See HPI otherwise negative  Objective: Vital Signs: Ht 5\' 6"  (1.676 m)   Wt 172 lb (78 kg)   BMI 27.76 kg/m   Physical Exam General: Well-developed well-nourished male no acute distress mood affect appropriate. Ortho Exam Left knee full range of motion.  Tenderness along the posterior medial joint line.  No instability valgus varus stressing McMurray's positive.  Slight edema of the left knee compared to right knee.  No abnormal warmth or erythema left knee.  Specialty Comments:  No specialty comments available.  Imaging: No results found.   PMFS History: Patient Active Problem List   Diagnosis Date Noted  . Status post arthroscopy of right knee 10/28/2017  . Medial meniscus, posterior horn derangement, right 10/17/2017   Past Medical History:  Diagnosis Date  . Arthritis   . Depression   . Headache   . Hypertension    NO MEDS , patient reports sometimes elevated   . Pre-diabetes    ? few years back doctor mentioned "sugar may have been a little elevated"     No family history on file.  Past Surgical History:  Procedure Laterality Date  . KNEE  ARTHROSCOPY WITH MEDIAL MENISECTOMY Right 10/17/2017   Procedure: RIGHT KNEE ARTHROSCOPY WITH PARTIAL MEDIAL MENISCECTOMY AND DEBRIDEMENT;  Surgeon: Kathryne Hitch, MD;  Location: [facility];  Service: Orthopedics;  Laterality: Right;  . NO PAST SURGERIES     Social History   Occupational History  . Not on file  Tobacco Use  . Smoking status: Never Smoker  . Smokeless tobacco: Never Used  Vaping Use  . Vaping Use: Never used  Substance and Sexual Activity  . Alcohol use: Yes    Comment: SOCIAL   . Drug use: Never  . Sexual activity: Not on file

## 2020-02-01 ENCOUNTER — Other Ambulatory Visit: Payer: Self-pay

## 2020-02-01 DIAGNOSIS — M25562 Pain in left knee: Secondary | ICD-10-CM

## 2020-02-05 ENCOUNTER — Telehealth: Payer: Self-pay | Admitting: Orthopaedic Surgery

## 2020-02-05 NOTE — Telephone Encounter (Signed)
Called patient left message on voicemail to call back to schedule an appointment for MRI review with Dr Magnus Ivan

## 2020-02-24 ENCOUNTER — Other Ambulatory Visit: Payer: Self-pay

## 2020-03-18 ENCOUNTER — Ambulatory Visit
Admission: RE | Admit: 2020-03-18 | Discharge: 2020-03-18 | Disposition: A | Discharge status: Home / Self Care | Payer: Self-pay | Source: Ambulatory Visit | Attending: Physician Assistant | Admitting: Physician Assistant

## 2020-03-18 ENCOUNTER — Other Ambulatory Visit: Payer: Self-pay

## 2020-03-18 DIAGNOSIS — M25562 Pain in left knee: Secondary | ICD-10-CM

## 2020-03-24 ENCOUNTER — Other Ambulatory Visit: Payer: Self-pay

## 2020-03-24 ENCOUNTER — Ambulatory Visit (INDEPENDENT_AMBULATORY_CARE_PROVIDER_SITE_OTHER): Payer: Self-pay | Admitting: Physician Assistant

## 2020-03-24 ENCOUNTER — Encounter: Payer: Self-pay | Admitting: Physician Assistant

## 2020-03-24 DIAGNOSIS — S83241D Other tear of medial meniscus, current injury, right knee, subsequent encounter: Secondary | ICD-10-CM

## 2020-03-24 NOTE — Progress Notes (Signed)
HPI: Nathaniel Bailey returns today to go over the MRI of his left knee.  He continues to have catching and giving way of the knee and also pain in the medial aspect of the knee. MRI left knee dated 03/18/2020 showed medial meniscal tear involving the posterior horn and body with a small flap component.  Medial and patellofemoral compartments show some mild articular thinning but no full-thickness defects.  Lateral compartment well-preserved.  Discoid meniscus of the lateral compartment.  Physical exam: Left knee good range of motion tenderness over the medial joint line.  Impression: Left knee medial meniscal tear  Plan: Due to the fact the patient is failed conservative treatment and continues to have pain along the mechanical symptoms of the knee recommend left knee arthroscopy with debridement and partial medial meniscectomy.  Questions were encouraged and answered today using an interpreter.  He will follow up with [place] 1 week postop.  Risk of infection prolonged pain and DVT discussed with patient.

## 2020-03-25 ENCOUNTER — Other Ambulatory Visit: Payer: Self-pay | Admitting: Physician Assistant

## 2020-03-25 ENCOUNTER — Other Ambulatory Visit (HOSPITAL_COMMUNITY)
Admission: RE | Admit: 2020-03-25 | Discharge: 2020-03-25 | Disposition: A | Discharge status: Home / Self Care | Payer: HRSA Program | Source: Ambulatory Visit | Attending: Orthopaedic Surgery | Admitting: Orthopaedic Surgery

## 2020-03-25 ENCOUNTER — Other Ambulatory Visit: Payer: Self-pay

## 2020-03-25 ENCOUNTER — Encounter (HOSPITAL_BASED_OUTPATIENT_CLINIC_OR_DEPARTMENT_OTHER): Payer: Self-pay | Admitting: Orthopaedic Surgery

## 2020-03-25 DIAGNOSIS — Z01812 Encounter for preprocedural laboratory examination: Secondary | ICD-10-CM | POA: Diagnosis present

## 2020-03-25 DIAGNOSIS — Z20822 Contact with and (suspected) exposure to covid-19: Secondary | ICD-10-CM | POA: Insufficient documentation

## 2020-03-25 LAB — SARS CORONAVIRUS 2 (TAT 6-24 HRS): SARS Coronavirus 2: NEGATIVE

## 2020-03-29 ENCOUNTER — Ambulatory Visit (HOSPITAL_BASED_OUTPATIENT_CLINIC_OR_DEPARTMENT_OTHER)
Admission: RE | Admit: 2020-03-29 | Discharge: 2020-03-29 | Disposition: A | Discharge status: Home / Self Care | Payer: Self-pay | Attending: Orthopaedic Surgery | Admitting: Orthopaedic Surgery

## 2020-03-29 ENCOUNTER — Ambulatory Visit (HOSPITAL_BASED_OUTPATIENT_CLINIC_OR_DEPARTMENT_OTHER): Payer: Self-pay | Admitting: Anesthesiology

## 2020-03-29 ENCOUNTER — Encounter (HOSPITAL_BASED_OUTPATIENT_CLINIC_OR_DEPARTMENT_OTHER)
Admission: RE | Disposition: A | Discharge status: Home / Self Care | Payer: Self-pay | Source: Home / Self Care | Attending: Orthopaedic Surgery

## 2020-03-29 ENCOUNTER — Encounter (HOSPITAL_BASED_OUTPATIENT_CLINIC_OR_DEPARTMENT_OTHER): Payer: Self-pay | Admitting: Orthopaedic Surgery

## 2020-03-29 ENCOUNTER — Other Ambulatory Visit: Payer: Self-pay

## 2020-03-29 DIAGNOSIS — Y939 Activity, unspecified: Secondary | ICD-10-CM | POA: Insufficient documentation

## 2020-03-29 DIAGNOSIS — Z79899 Other long term (current) drug therapy: Secondary | ICD-10-CM | POA: Insufficient documentation

## 2020-03-29 DIAGNOSIS — X58XXXA Exposure to other specified factors, initial encounter: Secondary | ICD-10-CM | POA: Insufficient documentation

## 2020-03-29 DIAGNOSIS — F329 Major depressive disorder, single episode, unspecified: Secondary | ICD-10-CM | POA: Insufficient documentation

## 2020-03-29 DIAGNOSIS — S83242D Other tear of medial meniscus, current injury, left knee, subsequent encounter: Secondary | ICD-10-CM

## 2020-03-29 DIAGNOSIS — M1990 Unspecified osteoarthritis, unspecified site: Secondary | ICD-10-CM | POA: Insufficient documentation

## 2020-03-29 DIAGNOSIS — R519 Headache, unspecified: Secondary | ICD-10-CM | POA: Insufficient documentation

## 2020-03-29 DIAGNOSIS — M94262 Chondromalacia, left knee: Secondary | ICD-10-CM | POA: Insufficient documentation

## 2020-03-29 DIAGNOSIS — S83232A Complex tear of medial meniscus, current injury, left knee, initial encounter: Secondary | ICD-10-CM | POA: Insufficient documentation

## 2020-03-29 DIAGNOSIS — I10 Essential (primary) hypertension: Secondary | ICD-10-CM | POA: Insufficient documentation

## 2020-03-29 HISTORY — PX: KNEE ARTHROSCOPY: SHX127

## 2020-03-29 SURGERY — ARTHROSCOPY, KNEE
Anesthesia: General | Site: Knee | Laterality: Left

## 2020-03-29 MED ORDER — DEXAMETHASONE SODIUM PHOSPHATE 10 MG/ML IJ SOLN
INTRAMUSCULAR | Status: DC | PRN
  Administered 2020-03-29: 10 mg via INTRAVENOUS

## 2020-03-29 MED ORDER — PROPOFOL 10 MG/ML IV BOLUS
INTRAVENOUS | Status: DC | PRN
  Administered 2020-03-29: 200 mg via INTRAVENOUS

## 2020-03-29 MED ORDER — LIDOCAINE 2% (20 MG/ML) 5 ML SYRINGE
INTRAMUSCULAR | Status: AC
  Filled 2020-03-29: qty 5

## 2020-03-29 MED ORDER — OXYCODONE HCL 5 MG/5ML PO SOLN: 5.0000 mg | Freq: Once | ORAL | Status: AC | PRN

## 2020-03-29 MED ORDER — CEFAZOLIN SODIUM-DEXTROSE 2-4 GM/100ML-% IV SOLN
2.0000 g | INTRAVENOUS | Status: AC
  Administered 2020-03-29: 2 g via INTRAVENOUS

## 2020-03-29 MED ORDER — ACETAMINOPHEN 160 MG/5ML PO SOLN: 325.0000 mg | ORAL | Status: DC | PRN

## 2020-03-29 MED ORDER — ONDANSETRON HCL 4 MG/2ML IJ SOLN: 4.0000 mg | Freq: Once | INTRAMUSCULAR | Status: DC | PRN

## 2020-03-29 MED ORDER — FENTANYL CITRATE (PF) 100 MCG/2ML IJ SOLN
INTRAMUSCULAR | Status: AC
  Filled 2020-03-29: qty 2

## 2020-03-29 MED ORDER — PROPOFOL 10 MG/ML IV BOLUS
INTRAVENOUS | Status: AC
  Filled 2020-03-29: qty 20

## 2020-03-29 MED ORDER — DEXAMETHASONE SODIUM PHOSPHATE 10 MG/ML IJ SOLN
INTRAMUSCULAR | Status: AC
  Filled 2020-03-29: qty 1

## 2020-03-29 MED ORDER — MIDAZOLAM HCL 5 MG/5ML IJ SOLN
INTRAMUSCULAR | Status: DC | PRN
  Administered 2020-03-29: 2 mg via INTRAVENOUS

## 2020-03-29 MED ORDER — ONDANSETRON HCL 4 MG/2ML IJ SOLN
INTRAMUSCULAR | Status: DC | PRN
  Administered 2020-03-29: 4 mg via INTRAVENOUS

## 2020-03-29 MED ORDER — HYDROCODONE-ACETAMINOPHEN 5-325 MG PO TABS: 1.0000 | ORAL_TABLET | Freq: Four times a day (QID) | ORAL | 0 refills | Status: DC | PRN

## 2020-03-29 MED ORDER — LIDOCAINE 2% (20 MG/ML) 5 ML SYRINGE
INTRAMUSCULAR | Status: DC | PRN
  Administered 2020-03-29: 60 mg via INTRAVENOUS

## 2020-03-29 MED ORDER — MEPERIDINE HCL 25 MG/ML IJ SOLN: 6.2500 mg | INTRAMUSCULAR | Status: DC | PRN

## 2020-03-29 MED ORDER — ACETAMINOPHEN 325 MG PO TABS: 325.0000 mg | ORAL_TABLET | ORAL | Status: DC | PRN

## 2020-03-29 MED ORDER — FENTANYL CITRATE (PF) 100 MCG/2ML IJ SOLN
INTRAMUSCULAR | Status: DC | PRN
Start: 2020-03-29 — End: 2020-03-29
  Administered 2020-03-29 (×2): 50 ug via INTRAVENOUS

## 2020-03-29 MED ORDER — HYDROCODONE-ACETAMINOPHEN 5-325 MG PO TABS
1.0000 | ORAL_TABLET | Freq: Four times a day (QID) | ORAL | 0 refills | Status: DC | PRN
Start: 2020-03-29 — End: 2020-03-29

## 2020-03-29 MED ORDER — BUPIVACAINE HCL (PF) 0.25 % IJ SOLN
INTRAMUSCULAR | Status: DC | PRN
  Administered 2020-03-29: 20 mL

## 2020-03-29 MED ORDER — ONDANSETRON HCL 4 MG/2ML IJ SOLN
INTRAMUSCULAR | Status: AC
  Filled 2020-03-29: qty 2

## 2020-03-29 MED ORDER — OXYCODONE HCL 5 MG PO TABS
ORAL_TABLET | ORAL | Status: AC
  Filled 2020-03-29: qty 1

## 2020-03-29 MED ORDER — OXYCODONE HCL 5 MG PO TABS
5.0000 mg | ORAL_TABLET | Freq: Once | ORAL | Status: AC | PRN
  Administered 2020-03-29: 5 mg via ORAL

## 2020-03-29 MED ORDER — MORPHINE SULFATE (PF) 4 MG/ML IV SOLN
INTRAVENOUS | Status: DC | PRN
Start: 2020-03-29 — End: 2020-03-29
  Administered 2020-03-29: 4 mg

## 2020-03-29 MED ORDER — MIDAZOLAM HCL 2 MG/2ML IJ SOLN
INTRAMUSCULAR | Status: AC
  Filled 2020-03-29: qty 2

## 2020-03-29 MED ORDER — LACTATED RINGERS IV SOLN: INTRAVENOUS | Status: DC

## 2020-03-29 MED ORDER — FENTANYL CITRATE (PF) 100 MCG/2ML IJ SOLN
25.0000 ug | INTRAMUSCULAR | Status: DC | PRN
  Administered 2020-03-29 (×4): 25 ug via INTRAVENOUS

## 2020-03-29 MED ORDER — LABETALOL HCL 5 MG/ML IV SOLN
5.0000 mg | INTRAVENOUS | Status: DC | PRN
  Administered 2020-03-29: 5 mg via INTRAVENOUS

## 2020-03-29 MED ORDER — ONDANSETRON 4 MG PO TBDP: 4.0000 mg | ORAL_TABLET | Freq: Three times a day (TID) | ORAL | 0 refills | Status: DC | PRN

## 2020-03-29 MED ORDER — LABETALOL HCL 5 MG/ML IV SOLN
INTRAVENOUS | Status: AC
  Filled 2020-03-29: qty 4

## 2020-03-29 MED ORDER — MORPHINE SULFATE (PF) 4 MG/ML IV SOLN
INTRAVENOUS | Status: AC
  Filled 2020-03-29: qty 1

## 2020-03-29 SURGICAL SUPPLY — 37 items
BLADE EXCALIBUR 4.0MM X 13CM (MISCELLANEOUS)
BLADE EXCALIBUR 4.0X13 (MISCELLANEOUS) IMPLANT
BNDG ELASTIC 6X5.8 VLCR STR LF (GAUZE/BANDAGES/DRESSINGS) ×3 IMPLANT
COVER WAND RF STERILE (DRAPES) IMPLANT
DISSECTOR  3.8MM X 13CM (MISCELLANEOUS)
DISSECTOR 3.8MM X 13CM (MISCELLANEOUS) IMPLANT
DRAPE ARTHROSCOPY W/POUCH 90 (DRAPES) ×3 IMPLANT
DRAPE U-SHAPE 47X51 STRL (DRAPES) ×3 IMPLANT
DRSG PAD ABDOMINAL 8X10 ST (GAUZE/BANDAGES/DRESSINGS) ×3 IMPLANT
DURAPREP 26ML APPLICATOR (WOUND CARE) ×3 IMPLANT
ELECT MENISCUS 165MM 90D (ELECTRODE) IMPLANT
ELECT REM PT RETURN 9FT ADLT (ELECTROSURGICAL)
ELECTRODE REM PT RTRN 9FT ADLT (ELECTROSURGICAL) IMPLANT
EXCALIBUR 3.8MM X 13CM (MISCELLANEOUS) ×3 IMPLANT
GAUZE SPONGE 4X4 12PLY STRL (GAUZE/BANDAGES/DRESSINGS) ×3 IMPLANT
GAUZE XEROFORM 1X8 LF (GAUZE/BANDAGES/DRESSINGS) ×3 IMPLANT
GLOVE BIOGEL PI IND STRL 7.0 (GLOVE) ×2 IMPLANT
GLOVE BIOGEL PI IND STRL 8 (GLOVE) ×2 IMPLANT
GLOVE BIOGEL PI INDICATOR 7.0 (GLOVE) ×4
GLOVE BIOGEL PI INDICATOR 8 (GLOVE) ×4
GLOVE ECLIPSE 6.5 STRL STRAW (GLOVE) ×3 IMPLANT
GLOVE ORTHO TXT STRL SZ7.5 (GLOVE) ×3 IMPLANT
GLOVE SURG ORTHO 8.0 STRL STRW (GLOVE) ×3 IMPLANT
GOWN STRL REUS W/ TWL LRG LVL3 (GOWN DISPOSABLE) ×2 IMPLANT
GOWN STRL REUS W/ TWL XL LVL3 (GOWN DISPOSABLE) ×1 IMPLANT
GOWN STRL REUS W/TWL LRG LVL3 (GOWN DISPOSABLE) ×6
GOWN STRL REUS W/TWL XL LVL3 (GOWN DISPOSABLE) ×3
KNEE WRAP E Z 3 GEL PACK (MISCELLANEOUS) ×3 IMPLANT
MANIFOLD NEPTUNE II (INSTRUMENTS) ×3 IMPLANT
PACK ARTHROSCOPY DSU (CUSTOM PROCEDURE TRAY) ×3 IMPLANT
PACK BASIN DAY SURGERY FS (CUSTOM PROCEDURE TRAY) ×3 IMPLANT
PADDING CAST COTTON 6X4 STRL (CAST SUPPLIES) ×3 IMPLANT
PENCIL SMOKE EVACUATOR (MISCELLANEOUS) IMPLANT
SUT ETHILON 3 0 PS 1 (SUTURE) ×3 IMPLANT
TOWEL GREEN STERILE FF (TOWEL DISPOSABLE) ×3 IMPLANT
TUBING ARTHROSCOPY IRRIG 16FT (MISCELLANEOUS) ×3 IMPLANT
WATER STERILE IRR 1000ML POUR (IV SOLUTION) ×3 IMPLANT

## 2020-03-29 NOTE — Anesthesia Procedure Notes (Signed)
Procedure Name: LMA Insertion Date/Time: 03/29/2020 2:23 PM Performed by: Montez Morita, Lanessa Shill W, CRNA Pre-anesthesia Checklist: Patient identified, Emergency Drugs available, Suction available and Patient being monitored Patient Re-evaluated:Patient Re-evaluated prior to induction Oxygen Delivery Method: Circle system utilized Preoxygenation: Pre-oxygenation with 100% oxygen Induction Type: IV induction Ventilation: Mask ventilation without difficulty LMA: LMA inserted LMA Size: 5.0 Number of attempts: 1 Placement Confirmation: positive ETCO2 and breath sounds checked- equal and bilateral Tube secured with: Tape Dental Injury: Teeth and Oropharynx as per pre-operative assessment

## 2020-03-29 NOTE — Anesthesia Preprocedure Evaluation (Addendum)
Anesthesia Evaluation  Patient identified by MRN, date of birth, ID band Patient awake    Reviewed: Allergy & Precautions, NPO status , Patient's Chart, lab work & pertinent test results  Airway Mallampati: II  TM Distance: >3 FB Neck ROM: Full    Dental  (+) Teeth Intact, Caps, Poor Dentition,    Pulmonary neg pulmonary ROS,    Pulmonary exam normal breath sounds clear to auscultation       Cardiovascular hypertension, Normal cardiovascular exam Rhythm:Regular Rate:Normal     Neuro/Psych  Headaches, PSYCHIATRIC DISORDERS Depression    GI/Hepatic negative GI ROS, Neg liver ROS,   Endo/Other  negative endocrine ROS  Renal/GU negative Renal ROS  negative genitourinary   Musculoskeletal  (+) Arthritis , Osteoarthritis,  Torn medial meniscus right knee   Abdominal   Peds  Hematology negative hematology ROS (+)   Anesthesia Other Findings   Reproductive/Obstetrics                            Anesthesia Physical  Anesthesia Plan  ASA: II  Anesthesia Plan: General   Post-op Pain Management:    Induction: Intravenous  PONV Risk Score and Plan: Midazolam, Dexamethasone, Ondansetron and Treatment may vary due to age or medical condition  Airway Management Planned: LMA and Oral ETT  Additional Equipment:   Intra-op Plan:   Post-operative Plan: Extubation in OR  Informed Consent: I have reviewed the patients History and Physical, chart, labs and discussed the procedure including the risks, benefits and alternatives for the proposed anesthesia with the patient or authorized representative who has indicated his/her understanding and acceptance.     Dental advisory given  Plan Discussed with: Anesthesiologist, CRNA and Surgeon  Anesthesia Plan Comments:         Anesthesia Quick Evaluation

## 2020-03-29 NOTE — Brief Op Note (Signed)
03/29/2020  3:05 PM  PATIENT:  Nathaniel Bailey  59 y.o. male  PRE-OPERATIVE DIAGNOSIS:  left knee meniscal tear medial  POST-OPERATIVE DIAGNOSIS:  left knee meniscal tear medial  PROCEDURE:  Procedure(s): LEFT KNEE ARTHROSCOPY WITH DEBRIDEMENT AND PARTIAL MEDIAL MENISCECTOMY (Left)  SURGEON:  Surgeon(s) and Role:    Kathryne Hitch, MD - Primary  PHYSICIAN ASSISTANT:  Rexene Edison, PA-C  ANESTHESIA:   local and general  COUNTS:  YES  TOURNIQUET:  * Missing tourniquet times found for documented tourniquets in log: [id number] *  DICTATION: .Other Dictation: Dictation Number [id number]  PLAN OF CARE: Discharge to home after PACU  PATIENT DISPOSITION:  PACU - hemodynamically stable.   Delay start of Pharmacological VTE agent (>24hrs) due to surgical blood loss or risk of bleeding: no

## 2020-03-29 NOTE — Discharge Instructions (Signed)
You may put all of your weight on your left knee as comfort allows. Expect left knee swelling today and over the next week. Ice and elevate your left knee as needed for swelling intermittently throughout the day. 24 to 48 hours from now, you may remove all your dressings and get your knee wet in the shower daily. After each shower, you may put small Band-Aids over your incisions daily.   Post Anesthesia Home Care Instructions  Activity: Get plenty of rest for the remainder of the day. A responsible individual must stay with you for 24 hours following the procedure.  For the next 24 hours, DO NOT: -Drive a car -Advertising copywriter -Drink alcoholic beverages -Take any medication unless instructed by your physician -Make any legal decisions or sign important papers.  Meals: Start with liquid foods such as gelatin or soup. Progress to regular foods as tolerated. Avoid greasy, spicy, heavy foods. If nausea and/or vomiting occur, drink only clear liquids until the nausea and/or vomiting subsides. Call your physician if vomiting continues.  Special Instructions/Symptoms: Your throat may feel dry or sore from the anesthesia or the breathing tube placed in your throat during surgery. If this causes discomfort, gargle with warm salt water. The discomfort should disappear within 24 hours.  If you had a scopolamine patch placed behind your ear for the management of post- operative nausea and/or vomiting:  1. The medication in the patch is effective for 72 hours, after which it should be removed.  Wrap patch in a tissue and discard in the trash. Wash hands thoroughly with soap and water. 2. You may remove the patch earlier than 72 hours if you experience unpleasant side effects which may include dry mouth, dizziness or visual disturbances. 3. Avoid touching the patch. Wash your hands with soap and water after contact with the patch.

## 2020-03-29 NOTE — Anesthesia Postprocedure Evaluation (Signed)
Anesthesia Post Note  Patient: Nathaniel Bailey  Procedure(s) Performed: LEFT KNEE ARTHROSCOPY WITH DEBRIDEMENT AND PARTIAL MEDIAL MENISCECTOMY (Left Knee)     Patient location during evaluation: Phase II Anesthesia Type: General Level of consciousness: awake and alert, patient cooperative and oriented Pain management: pain level controlled Vital Signs Assessment: post-procedure vital signs reviewed and stable Respiratory status: spontaneous breathing, nonlabored ventilation and respiratory function stable Cardiovascular status: stable and blood pressure returned to baseline Postop Assessment: able to ambulate, no apparent nausea or vomiting and adequate PO intake Anesthetic complications: no   No complications documented.  Last Vitals:  Vitals:   03/29/20 1545 03/29/20 1600  BP: (!) 159/97 (!) 161/98  Pulse: 83 75  Resp: 14 13  Temp:    SpO2: 97% 95%    Last Pain:  Vitals:   03/29/20 1559  TempSrc:   PainSc: Asleep                 Naethan Bracewell,E. Nathali Vent

## 2020-03-29 NOTE — Op Note (Signed)
NAME: Nathaniel Bailey, Nathaniel Bailey MEDICAL RECORD [id number] ACCOUNT [id number] DATE OF BIRTH:06/12/1961 FACILITY: [facility] LOCATION: [facility] PHYSICIAN:Autrey Human Aretha Parrot, MD  OPERATIVE REPORT  DATE OF PROCEDURE:  03/29/2020  PREOPERATIVE DIAGNOSIS:  Left knee symptomatic complex medial meniscal tear.  POSTOPERATIVE DIAGNOSES: 1.  Left knee symptomatic complex medial meniscal tear. 2.  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  PROCEDURE:  Left knee arthroscopy with partial medial meniscectomy and chondroplasty of medial femoral condyle.  SURGEON:  Vanita Panda.  Magnus Ivan, MD  ASSISTANT:  Richardean Canal, PA-C.  ANESTHESIA:   1.  General. 2.  Local.  ESTIMATED BLOOD LOSS:  Minimal.  COMPLICATIONS:  None.  INDICATIONS:  The patient is a 59 year old gentleman well known to me.  We actually performed arthroscopic surgery remotely on his right knee for a right knee medial meniscal tear with thinning of the cartilage on the medial compartment of his right  knee.  He started developing left knee pain for some now.  It has all been in the medial aspect of his knee.  After the failure of conservative treatment, a left knee MRI was performed and it did show a complex medial meniscal tear on that side of his  knee with thinning of the articular cartilage, but no full thickness cartilage loss.  Given his continued symptoms, we recommended arthroscopic intervention since he has done so well on the right side.  We did have explained this to an interpreter since  he is non-English speaking.  He did agree to proceed with surgery.  We had a long and thorough discussion about the risks and benefits of the surgery and informed consent was obtained.  DESCRIPTION OF PROCEDURE:  After informed consent was obtained and appropriate left knee was marked, he was brought to the operating room and laid supine on the operating table.  General anesthesia was then obtained.  His left thigh, knee,  leg, and ankle  were prepped and draped with DuraPrep and sterile drapes including a sterile stockinette.  With the bed raised and a lateral leg post utilized,  the left operative knee was flexed off the side of the table.  A time-out was called and he was identified  as correct patient, correct left knee.  I then made our anterolateral arthroscopy portal and inserted a cannula into the knee and did not drain any significant effusion.  I then placed the camera in the knee went to the medial compartment.  We did find a  complex midbody medial meniscal tear and also grade III chondromalacia around the weightbearing surface of the medial femoral condyle and medial tibial plateau.  We made an anteromedial arthroscopy portal and carried out a partial medial meniscectomy  using arthroscopic shaver and basket forceps/biters.  We also performed a chondroplasty of the medial femoral condyle as well.  We assessed the ACL and PCL and found them to be intact.  We were able to perform a partial medial meniscectomy to get this  back to a stable margin.  The lateral compartment of his knee had no significant findings and the patellofemoral joint had some chondromalacia of the trochlea groove.  I then debrided some of Hoffa's fat pad that was inflamed.  We then allowed fluid  lavage of the knee and drained all the fluid from the knee.  We closed the portal sites with interrupted nylon suture.  Marcaine was then inserted into the knee joint and the portal sites.  A well-padded sterile dressing was placed.  He was awakened,  extubated, and taken to recovery room in stable condition with all final counts being correct and no complications noted.  VN/NUANCE  D:03/29/2020 T:03/29/2020 JOB:[id number]

## 2020-03-29 NOTE — Transfer of Care (Signed)
Immediate Anesthesia Transfer of Care Note  Patient: Nathaniel Bailey  Procedure(s) Performed: LEFT KNEE ARTHROSCOPY WITH DEBRIDEMENT AND PARTIAL MEDIAL MENISCECTOMY (Left Knee)  Patient Location: PACU  Anesthesia Type:General  Level of Consciousness: sedated  Airway & Oxygen Therapy: Patient Spontanous Breathing and Patient connected to face mask oxygen  Post-op Assessment: Report given to RN and Post -op Vital signs reviewed and stable  Post vital signs: Reviewed and stable  Last Vitals:  Vitals Value Taken Time  BP 115/75 03/29/20 1513  Temp    Pulse 70 03/29/20 1514  Resp 15 03/29/20 1514  SpO2 98 % 03/29/20 1514  Vitals shown include unvalidated device data.  Last Pain:  Vitals:   03/29/20 1317  TempSrc: Oral  PainSc: 8       Patients Stated Pain Goal: 5 (03/29/20 1317)  Complications: No complications documented.

## 2020-03-29 NOTE — H&P (Signed)
Nathaniel Bailey is an 59 y.o. male.   Chief Complaint: Left knee pain with locking and catching HPI: The patient is a non-English speaking Hispanic gentleman who has a noted left knee medial meniscal tear.  This is found via MRI after the failure conservative treatment.  He is actually well-known to me and I had to perform a right knee arthroscopy remotely for a right knee medial meniscal tear.  He says that the left knee has the same sensation and locking catching with pain on the medial aspect that it did when he had performed surgery on the right knee.  He does understand the recommendation for a knee arthroscopy for the left knee today given the failed conservative treatment and the MRI findings.  Past Medical History:  Diagnosis Date  . Arthritis   . Depression   . Headache   . Hypertension    NO MEDS , patient reports sometimes elevated   . Pre-diabetes    ? few years back doctor mentioned "sugar may have been a little elevated"     Past Surgical History:  Procedure Laterality Date  . KNEE ARTHROSCOPY WITH MEDIAL MENISECTOMY Right 10/17/2017   Procedure: RIGHT KNEE ARTHROSCOPY WITH PARTIAL MEDIAL MENISCECTOMY AND DEBRIDEMENT;  Surgeon: Kathryne Hitch, MD;  Location: [facility];  Service: Orthopedics;  Laterality: Right;    History reviewed. No pertinent family history. Social History:  reports that he has never smoked. He has never used smokeless tobacco. He reports current alcohol use. He reports that he does not use drugs.  Allergies: No Known Allergies  Medications Prior to Admission  Medication Sig Dispense Refill  . HYDROcodone-acetaminophen (NORCO/VICODIN) 5-325 MG tablet Take 1 tablet by mouth every 6 (six) hours as needed for moderate pain. 40 tablet 0  . nabumetone (RELAFEN) 750 MG tablet Take 1 tablet (750 mg total) by mouth 2 (two) times daily as needed. 60 tablet 1  . ibuprofen (ADVIL,MOTRIN) 600 MG tablet Take 1 tablet (600 mg total) by mouth every 6  (six) hours as needed for mild pain or moderate pain. 30 tablet 0  . Multiple Vitamin (MULTIVITAMIN) tablet Take 1 tablet by mouth daily.    . traMADol (ULTRAM) 50 MG tablet Take 1-2 tablets (50-100 mg total) by mouth every 6 (six) hours as needed. 60 tablet 0    No results found for this or any previous visit (from the past 48 hour(s)). No results found.  Review of Systems  All other systems reviewed and are negative.   Blood pressure (!) 165/98, pulse 70, temperature 98.3 F (36.8 C), temperature source Oral, resp. rate 16, height 5\' 6"  (1.676 m), weight 79 kg, SpO2 100 %. Physical Exam Vitals reviewed.  Constitutional:      Appearance: Normal appearance.  HENT:     Head: Normocephalic and atraumatic.  Eyes:     Pupils: Pupils are equal, round, and reactive to light.  Cardiovascular:     Rate and Rhythm: Normal rate.  Pulmonary:     Effort: Pulmonary effort is normal.  Abdominal:     Palpations: Abdomen is soft.  Musculoskeletal:     Cervical back: Normal range of motion.     Left knee: Effusion and bony tenderness present. Decreased range of motion. Tenderness present over the medial joint line. Abnormal alignment and abnormal meniscus.  Neurological:     Mental Status: He is alert and oriented to person, place, and time.  Psychiatric:        Behavior: Behavior normal.  Assessment/Plan Left knee complex medial meniscal tear  Our plan is to proceed to surgery today as an outpatient for a left knee arthroscopy with a partial medial meniscectomy.  The risk and benefits of been explained in detail the surgery and informed consent is obtained.  The left knee has been marked.  Kathryne Hitch, MD 03/29/2020, 2:16 PM

## 2020-03-30 ENCOUNTER — Encounter (HOSPITAL_BASED_OUTPATIENT_CLINIC_OR_DEPARTMENT_OTHER): Payer: Self-pay | Admitting: Orthopaedic Surgery

## 2020-04-05 ENCOUNTER — Ambulatory Visit (INDEPENDENT_AMBULATORY_CARE_PROVIDER_SITE_OTHER): Payer: Self-pay | Admitting: Orthopaedic Surgery

## 2020-04-05 ENCOUNTER — Encounter: Payer: Self-pay | Admitting: Orthopaedic Surgery

## 2020-04-05 DIAGNOSIS — S83242D Other tear of medial meniscus, current injury, left knee, subsequent encounter: Secondary | ICD-10-CM

## 2020-04-05 DIAGNOSIS — Z9889 Other specified postprocedural states: Secondary | ICD-10-CM

## 2020-04-05 MED ORDER — NABUMETONE 750 MG PO TABS: 750.0000 mg | ORAL_TABLET | Freq: Two times a day (BID) | ORAL | 3 refills | Status: DC | PRN

## 2020-04-05 NOTE — Progress Notes (Signed)
The patient is 1 week status post a left knee arthroscopy.  We found grade 3 cartilage in the medial femoral condyle and a complex medial meniscal tear from the mid body to the posterior horn.  We performed a partial medial meniscectomy and a chondroplasty in the medial compartment of his knee.  The remainder of his knee look good.  He does have an interpreter with him today.  He has a previous history of a right knee arthroscopy.  He still has pain with his right knee but there was moderate arthritis in the medial compartment of his right knee.  We had him on Relafen in the past.  He would like a refill of this today.  Examination of his left operative knee shows the incisions of healed nicely so remove the sutures.  He does have a moderate effusion of that knee so recommend an aspiration of the effusion and placing a steroid in the knee today.  He tolerated this well.  We will have him continue to work on just his mobility and quad strengthening.  I would like to see him back in the office in 4 weeks.

## 2020-05-03 ENCOUNTER — Ambulatory Visit (INDEPENDENT_AMBULATORY_CARE_PROVIDER_SITE_OTHER): Payer: Self-pay | Admitting: Orthopaedic Surgery

## 2020-05-03 ENCOUNTER — Encounter: Payer: Self-pay | Admitting: Orthopaedic Surgery

## 2020-05-03 DIAGNOSIS — Z9889 Other specified postprocedural states: Secondary | ICD-10-CM

## 2020-05-03 DIAGNOSIS — G8929 Other chronic pain: Secondary | ICD-10-CM

## 2020-05-03 DIAGNOSIS — M25561 Pain in right knee: Secondary | ICD-10-CM

## 2020-05-03 DIAGNOSIS — S83242D Other tear of medial meniscus, current injury, left knee, subsequent encounter: Secondary | ICD-10-CM

## 2020-05-03 MED ORDER — LIDOCAINE HCL 1 % IJ SOLN
3.0000 mL | INTRAMUSCULAR | Status: AC | PRN
  Administered 2020-05-03: 3 mL

## 2020-05-03 MED ORDER — METHYLPREDNISOLONE ACETATE 40 MG/ML IJ SUSP
40.0000 mg | INTRAMUSCULAR | Status: AC | PRN
  Administered 2020-05-03: 40 mg via INTRA_ARTICULAR

## 2020-05-03 MED ORDER — TRAMADOL HCL 50 MG PO TABS: 100.0000 mg | ORAL_TABLET | Freq: Four times a day (QID) | ORAL | 0 refills | Status: DC | PRN

## 2020-05-03 NOTE — Progress Notes (Signed)
Office Visit Note   Patient: Nathaniel Bailey           Date of Birth: 06-Nov-1960           MRN: [id number] Visit Date: 05/03/2020              Requested by: No referring provider defined for this encounter. PCP: Patient, No Pcp Per   Assessment & Plan: Visit Diagnoses:  1. Other tear of medial meniscus, current injury, left knee, subsequent encounter   2. Status post arthroscopy of left knee   3. Chronic pain of right knee     Plan: I did recommend an aspiration of his left most recent operative knee today as well as a steroid injection in both knees today.  He will also continue his Relafen and work on quad strengthening exercises. I did place a steroid injection safely in both knees today. There was only about 20 cc of fluid that I pulled off the left knee. We will see him back in 4 weeks to see how he is doing overall. He will still use his cane. We will try some tramadol to offset his pain as well.  Follow-Up Instructions: No follow-ups on file.   Orders:  Orders Placed This Encounter  Procedures  . Large Joint Inj  . Large Joint Inj   No orders of the defined types were placed in this encounter.     Procedures: Large Joint Inj: L knee on 05/03/2020 10:15 AM Indications: diagnostic evaluation and pain Details: 22 G 1.5 in needle, superolateral approach  Arthrogram: No  Medications: 3 mL lidocaine 1 %; 40 mg methylPREDNISolone acetate 40 MG/ML Outcome: tolerated well, no immediate complications Procedure, treatment alternatives, risks and benefits explained, specific risks discussed. Consent was given by the patient. Immediately prior to procedure a time out was called to verify the correct patient, procedure, equipment, support staff and site/side marked as required. Patient was prepped and draped in the usual sterile fashion.   Large Joint Inj: R knee on 05/03/2020 10:16 AM Indications: diagnostic evaluation and pain Details: 22 G 1.5 in needle,  superolateral approach  Arthrogram: No  Medications: 3 mL lidocaine 1 %; 40 mg methylPREDNISolone acetate 40 MG/ML Outcome: tolerated well, no immediate complications Procedure, treatment alternatives, risks and benefits explained, specific risks discussed. Consent was given by the patient. Immediately prior to procedure a time out was called to verify the correct patient, procedure, equipment, support staff and site/side marked as required. Patient was prepped and draped in the usual sterile fashion.       Clinical Data: No additional findings.   Subjective: Chief Complaint  Patient presents with  . Left Knee - Follow-up  The patient is well-known to [place].  He is here with an interpreter today.  He is about 5 to 6 weeks out from a left knee arthroscopy with a partial medial meniscectomy.  He does have moderate knee pain and swelling.  He also has a right knee pain.  We did perform an arthroscopic intervention remotely on the right knee.  He has areas of significant cartilage thinning and loss on the medial aspect of his right knee.  He does report clicking and popping of the right knee and he points the medial joint line as a source of tenderness on the right knee.  He has been taking Relafen as an anti-inflammatory.  He says it is not really helping him much.  He does ambulate using a cane as well.  HPI  Review of Systems There is currently no acute changes in his medical status.  Objective: Vital Signs: There were no vitals taken for this visit.  Physical Exam He is alert and orient x3 and in no acute distress Ortho Exam Examination of his left knee still shows a moderate effusion postoperatively.  He has medial joint line tenderness but good range of motion of the knee.  Examination of his right knee does show some varus malalignment with significant medial joint tenderness but no effusion.  The right knee is ligaments is stable with good range of motion but just  painful. Specialty Comments:  No specialty comments available.  Imaging: No results found.   PMFS History: Patient Active Problem List   Diagnosis Date Noted  . Status post arthroscopy of left knee 04/05/2020  . Other tear of medial meniscus, current injury, left knee, subsequent encounter 03/29/2020  . Status post arthroscopy of right knee 10/28/2017  . Medial meniscus, posterior horn derangement, right 10/17/2017   Past Medical History:  Diagnosis Date  . Arthritis   . Depression   . Headache   . Hypertension    NO MEDS , patient reports sometimes elevated   . Pre-diabetes    ? few years back doctor mentioned "sugar may have been a little elevated"     History reviewed. No pertinent family history.  Past Surgical History:  Procedure Laterality Date  . KNEE ARTHROSCOPY Left 03/29/2020   Procedure: LEFT KNEE ARTHROSCOPY WITH DEBRIDEMENT AND PARTIAL MEDIAL MENISCECTOMY;  Surgeon: Kathryne Hitch, MD;  Location: [facility];  Service: Orthopedics;  Laterality: Left;  . KNEE ARTHROSCOPY WITH MEDIAL MENISECTOMY Right 10/17/2017   Procedure: RIGHT KNEE ARTHROSCOPY WITH PARTIAL MEDIAL MENISCECTOMY AND DEBRIDEMENT;  Surgeon: Kathryne Hitch, MD;  Location: [facility];  Service: Orthopedics;  Laterality: Right;   Social History   Occupational History  . Not on file  Tobacco Use  . Smoking status: Never Smoker  . Smokeless tobacco: Never Used  Vaping Use  . Vaping Use: Never used  Substance and Sexual Activity  . Alcohol use: Yes    Comment: SOCIAL   . Drug use: Never  . Sexual activity: Not on file

## 2020-05-10 ENCOUNTER — Other Ambulatory Visit: Payer: Self-pay | Admitting: Orthopaedic Surgery

## 2020-05-31 ENCOUNTER — Encounter: Payer: Self-pay | Admitting: Orthopaedic Surgery

## 2020-05-31 ENCOUNTER — Ambulatory Visit (INDEPENDENT_AMBULATORY_CARE_PROVIDER_SITE_OTHER): Payer: Self-pay | Admitting: Orthopaedic Surgery

## 2020-05-31 DIAGNOSIS — Z9889 Other specified postprocedural states: Secondary | ICD-10-CM

## 2020-05-31 MED ORDER — HYDROCODONE-ACETAMINOPHEN 5-325 MG PO TABS: 1.0000 | ORAL_TABLET | Freq: Four times a day (QID) | ORAL | 0 refills | Status: DC | PRN

## 2020-05-31 MED ORDER — HYDROCODONE-ACETAMINOPHEN 5-325 MG PO TABS
1.0000 | ORAL_TABLET | Freq: Four times a day (QID) | ORAL | 0 refills | Status: DC | PRN
Start: 2020-05-31 — End: 2021-02-10

## 2020-05-31 NOTE — Progress Notes (Signed)
Office Visit Note   Patient: Nathaniel Bailey           Date of Birth: 07-Jan-1961           MRN: [id number] Visit Date: 05/31/2020              Requested by: No referring provider defined for this encounter. PCP: Patient, No Pcp Per   Assessment & Plan: Visit Diagnoses:  1. Status post arthroscopy of left knee     Plan: We will do 1 final refill on his Norco he is to use these sparingly.  Did review with him quad strengthening exercises and had him demonstrate these back to me.  We will see him back in 3 months to see how well he is doing.  Questions encouraged and answered at length  Follow-Up Instructions: Return in about 3 months (around 08/31/2020).   Orders:  No orders of the defined types were placed in this encounter.  No orders of the defined types were placed in this encounter.     Procedures: No procedures performed   Clinical Data: No additional findings.   Subjective: Chief Complaint  Patient presents with   Left Knee - Follow-up   Right Knee - Follow-up    HPI Is a returns today with an interpreter stating that both knees are better since the injections.  He is ambulating with a cane he still using a knee brace on the left knee.  Again he is 10 weeks status post left knee arthroscopy for a partial medial meniscectomy and chondroplasty of medial femoral condyle.  He was found to have grade III chondromalacia of medial femoral condyle.  He states taking tramadol makes him feel crazy and dizzy.  He is taking melatonin.  He is asking for refill on his prior pain medication. Review of Systems See HPI otherwise negative  Objective: Vital Signs: There were no vitals taken for this visit.  Physical Exam General: Well-developed well-nourished male no acute distress Ortho Exam Bilateral knees no abnormal warmth erythema or effusion.  Tenderness along the medial joint line of the left knee.  Good range of motion of both knees.  Quad atrophy of the  left knee compared to the right. Specialty Comments:  No specialty comments available.  Imaging: No results found.   PMFS History: Patient Active Problem List   Diagnosis Date Noted   Status post arthroscopy of left knee 04/05/2020   Other tear of medial meniscus, current injury, left knee, subsequent encounter 03/29/2020   Status post arthroscopy of right knee 10/28/2017   Medial meniscus, posterior horn derangement, right 10/17/2017   Past Medical History:  Diagnosis Date   Arthritis    Depression    Headache    Hypertension    NO MEDS , patient reports sometimes elevated    Pre-diabetes    ? few years back doctor mentioned "sugar may have been a little elevated"     No family history on file.  Past Surgical History:  Procedure Laterality Date   KNEE ARTHROSCOPY Left 03/29/2020   Procedure: LEFT KNEE ARTHROSCOPY WITH DEBRIDEMENT AND PARTIAL MEDIAL MENISCECTOMY;  Surgeon: Kathryne Hitch, MD;  Location: [facility];  Service: Orthopedics;  Laterality: Left;   KNEE ARTHROSCOPY WITH MEDIAL MENISECTOMY Right 10/17/2017   Procedure: RIGHT KNEE ARTHROSCOPY WITH PARTIAL MEDIAL MENISCECTOMY AND DEBRIDEMENT;  Surgeon: Kathryne Hitch, MD;  Location: [facility];  Service: Orthopedics;  Laterality: Right;   Social History   Occupational History  Not on file  Tobacco Use   Smoking status: Never Smoker   Smokeless tobacco: Never Used  Vaping Use   Vaping Use: Never used  Substance and Sexual Activity   Alcohol use: Yes    Comment: SOCIAL    Drug use: Never   Sexual activity: Not on file

## 2020-08-31 ENCOUNTER — Encounter: Payer: Self-pay | Admitting: Orthopaedic Surgery

## 2020-08-31 ENCOUNTER — Ambulatory Visit (INDEPENDENT_AMBULATORY_CARE_PROVIDER_SITE_OTHER): Payer: Self-pay

## 2020-08-31 ENCOUNTER — Ambulatory Visit: Payer: Self-pay

## 2020-08-31 ENCOUNTER — Ambulatory Visit (INDEPENDENT_AMBULATORY_CARE_PROVIDER_SITE_OTHER): Payer: Self-pay | Admitting: Orthopaedic Surgery

## 2020-08-31 DIAGNOSIS — M25561 Pain in right knee: Secondary | ICD-10-CM

## 2020-08-31 DIAGNOSIS — M25562 Pain in left knee: Secondary | ICD-10-CM

## 2020-08-31 DIAGNOSIS — M1711 Unilateral primary osteoarthritis, right knee: Secondary | ICD-10-CM

## 2020-08-31 DIAGNOSIS — G8929 Other chronic pain: Secondary | ICD-10-CM

## 2020-08-31 MED ORDER — METHYLPREDNISOLONE ACETATE 40 MG/ML IJ SUSP
40.0000 mg | INTRAMUSCULAR | Status: AC | PRN
  Administered 2020-08-31: 40 mg via INTRA_ARTICULAR

## 2020-08-31 MED ORDER — LIDOCAINE HCL 1 % IJ SOLN
3.0000 mL | INTRAMUSCULAR | Status: AC | PRN
  Administered 2020-08-31: 3 mL

## 2020-08-31 NOTE — Progress Notes (Signed)
Office Visit Note   Patient: Nathaniel Bailey           Date of Birth: [birth year]           MRN: [id number] Visit Date: 08/31/2020              Requested by: No referring provider defined for this encounter. PCP: Patient, No Pcp Per   Assessment & Plan: Visit Diagnoses:  1. Chronic pain of right knee   2. Chronic pain of left knee   3. Unilateral primary osteoarthritis, right knee     Plan: I did speak to him in length about the possibility of knee replacement surgery in the future.  I showed him a knee model and explained as best I could what knee replacement surgery involves and why we are recommending at this standpoint.  He did request a steroid injection today in that right knee and I agree with it.  He want to talk to his family about this as well.  I will see him back in 4 weeks to see how is doing overall.  All question concerns were answered and addressed.  I do feel at this point he has exhausted conservative treatment measures and an knee replacement could be helpful with decreasing his pain, improve his mobility and his overall quality of life.  Follow-Up Instructions: Return in about 4 weeks (around 09/28/2020).   Orders:  Orders Placed This Encounter  Procedures  . Large Joint Inj  . XR Knee 1-2 Views Left  . XR Knee 1-2 Views Right   No orders of the defined types were placed in this encounter.     Procedures: Large Joint Inj: R knee on 08/31/2020 10:18 AM Indications: diagnostic evaluation and pain Details: 22 G 1.5 in needle, superolateral approach  Arthrogram: No  Medications: 3 mL lidocaine 1 %; 40 mg methylPREDNISolone acetate 40 MG/ML Outcome: tolerated well, no immediate complications Procedure, treatment alternatives, risks and benefits explained, specific risks discussed. Consent was given by the patient. Immediately prior to procedure a time out was called to verify the correct patient, procedure, equipment, support staff and site/side  marked as required. Patient was prepped and draped in the usual sterile fashion.       Clinical Data: No additional findings.   Subjective: Chief Complaint  Patient presents with  . Right Knee - Pain  . Left Knee - Pain  The patient comes in with worsening right knee pain over the last 3 months.  This is in the result had chronic pain for years now.  3 years ago we did perform arthroscopic surgery for the right knee with a partial medial meniscectomy.  He has gotten to where his knee hurts with weightbearing.  It is hard to bend his knee.  He has pain throughout the day with that knee.  He is non-English-speaking.  A Spanish interpreter is with him today.  His left knee has been feeling weak.  He does take hydrocodone on occasion but it makes him too dizzy.  He is 60 years old  HPI  Review of Systems Now.  There is currently listed no headache, chest pain, short of breath, fever, chills, nausea, vomiting  Objective: Vital Signs: There were no vitals taken for this visit.  Physical Exam Is alert and orient x3 and in no acute distress Ortho Exam Examination of his right knee does show varus malalignment that is correctable.  He has a mild effusion.  He has pain throughout the  arc of motion of his knee and is hard to bend and past 90 degrees due to pain.  There is quite significant patellofemoral crepitation. Specialty Comments:  No specialty comments available.  Imaging: XR Knee 1-2 Views Left  Result Date: 08/31/2020 2 views of the left knee show mild osteoarthritis  XR Knee 1-2 Views Right  Result Date: 08/31/2020 2 views of the right knee show moderate severe osteoarthritis with varus malalignment, medial joint space narrowing and severe patellofemoral narrowing.    PMFS History: Patient Active Problem List   Diagnosis Date Noted  . Unilateral primary osteoarthritis, right knee 08/31/2020  . Status post arthroscopy of left knee 04/05/2020  . Other tear of medial  meniscus, current injury, left knee, subsequent encounter 03/29/2020  . Status post arthroscopy of right knee 10/28/2017  . Medial meniscus, posterior horn derangement, right 10/17/2017   Past Medical History:  Diagnosis Date  . Arthritis   . Depression   . Headache   . Hypertension    NO MEDS , patient reports sometimes elevated   . Pre-diabetes    ? few years back doctor mentioned "sugar may have been a little elevated"     History reviewed. No pertinent family history.  Past Surgical History:  Procedure Laterality Date  . KNEE ARTHROSCOPY Left 03/29/2020   Procedure: LEFT KNEE ARTHROSCOPY WITH DEBRIDEMENT AND PARTIAL MEDIAL MENISCECTOMY;  Surgeon: Kathryne Hitch, MD;  Location: [facility];  Service: Orthopedics;  Laterality: Left;  . KNEE ARTHROSCOPY WITH MEDIAL MENISECTOMY Right 10/17/2017   Procedure: RIGHT KNEE ARTHROSCOPY WITH PARTIAL MEDIAL MENISCECTOMY AND DEBRIDEMENT;  Surgeon: Kathryne Hitch, MD;  Location: [facility];  Service: Orthopedics;  Laterality: Right;   Social History   Occupational History  . Not on file  Tobacco Use  . Smoking status: Never Smoker  . Smokeless tobacco: Never Used  Vaping Use  . Vaping Use: Never used  Substance and Sexual Activity  . Alcohol use: Yes    Comment: SOCIAL   . Drug use: Never  . Sexual activity: Not on file

## 2020-09-27 ENCOUNTER — Ambulatory Visit: Payer: Self-pay | Admitting: Orthopaedic Surgery

## 2020-11-01 ENCOUNTER — Ambulatory Visit: Payer: Self-pay | Admitting: Orthopaedic Surgery

## 2020-11-30 ENCOUNTER — Encounter: Payer: Self-pay | Admitting: Orthopaedic Surgery

## 2020-11-30 ENCOUNTER — Other Ambulatory Visit: Payer: Self-pay

## 2020-11-30 ENCOUNTER — Ambulatory Visit (INDEPENDENT_AMBULATORY_CARE_PROVIDER_SITE_OTHER): Payer: Self-pay | Admitting: Orthopaedic Surgery

## 2020-11-30 DIAGNOSIS — G8929 Other chronic pain: Secondary | ICD-10-CM

## 2020-11-30 DIAGNOSIS — M1711 Unilateral primary osteoarthritis, right knee: Secondary | ICD-10-CM

## 2020-11-30 DIAGNOSIS — M25562 Pain in left knee: Secondary | ICD-10-CM

## 2020-11-30 DIAGNOSIS — Z9889 Other specified postprocedural states: Secondary | ICD-10-CM

## 2020-11-30 DIAGNOSIS — M25561 Pain in right knee: Secondary | ICD-10-CM

## 2020-11-30 NOTE — Progress Notes (Signed)
The patient is well-known to me.  He is a 60 year old with bilateral knee pain and osteoarthritis.  We have actually performed arthroscopic surgery on both knees.  The right knee shows more arthritic findings.  At this point his right knee pain is daily and is detriment affecting his mobility, his quality of life and his actives daily living.  We have tried anti-inflammatories.  He has had multiple steroid injections in both knees and hyaluronic acid in both knees.  He thinks his left knee really only hurts due to compensation because of his right knee bothers him significantly.  The right knee does have more varus malalignment.  There is a mild effusion.  He has global tenderness throughout the arc of motion of that knee.  Through the interpreter again talked him about knee replacement surgery.  I showed him a knee model and explained in detail what replacement surgery involves.  I met also what else I can provide for him since all the other conservative treatment measures have failed.  He continues to work on Forensic scientist exercises as well.  He is not obese.  All questions and concerns were answered and addressed.  I did give him my surgery scheduler's card so he can think about this in terms of whether or not he wants to proceed with a right total knee arthroplasty to treat the pain from his osteoarthritis.  There is no other recommendations from my standpoint at this point given the failure of all conservative treatment modalities.

## 2020-12-06 ENCOUNTER — Other Ambulatory Visit: Payer: Self-pay | Admitting: Orthopaedic Surgery

## 2021-02-04 ENCOUNTER — Other Ambulatory Visit: Payer: Self-pay | Admitting: Orthopaedic Surgery

## 2021-02-10 ENCOUNTER — Telehealth: Payer: Self-pay

## 2021-02-10 ENCOUNTER — Other Ambulatory Visit: Payer: Self-pay | Admitting: Physician Assistant

## 2021-02-10 MED ORDER — HYDROCODONE-ACETAMINOPHEN 5-325 MG PO TABS
1.0000 | ORAL_TABLET | Freq: Four times a day (QID) | ORAL | 0 refills | Status: DC | PRN
Start: 2021-02-10 — End: 2021-09-29

## 2021-02-10 NOTE — Telephone Encounter (Signed)
Patient would like a RF on pain meds. He has appt scheduled.   CB [phone]

## 2021-02-13 ENCOUNTER — Other Ambulatory Visit: Payer: Self-pay | Admitting: Orthopaedic Surgery

## 2021-02-13 MED ORDER — NABUMETONE 750 MG PO TABS
750.0000 mg | ORAL_TABLET | Freq: Two times a day (BID) | ORAL | 3 refills | Status: DC | PRN
Start: 2021-02-13 — End: 2021-09-29

## 2021-02-13 MED ORDER — ACETAMINOPHEN-CODEINE #3 300-30 MG PO TABS: 1.0000 | ORAL_TABLET | Freq: Three times a day (TID) | ORAL | 0 refills | Status: DC | PRN

## 2021-03-01 ENCOUNTER — Ambulatory Visit (INDEPENDENT_AMBULATORY_CARE_PROVIDER_SITE_OTHER): Payer: Self-pay | Admitting: Orthopaedic Surgery

## 2021-03-01 VITALS — Ht 66.0 in | Wt 174.0 lb

## 2021-03-01 DIAGNOSIS — M25561 Pain in right knee: Secondary | ICD-10-CM

## 2021-03-01 DIAGNOSIS — G8929 Other chronic pain: Secondary | ICD-10-CM

## 2021-03-01 DIAGNOSIS — M1711 Unilateral primary osteoarthritis, right knee: Secondary | ICD-10-CM

## 2021-03-01 MED ORDER — TRAMADOL HCL 50 MG PO TABS: 100.0000 mg | ORAL_TABLET | Freq: Four times a day (QID) | ORAL | 0 refills | Status: DC | PRN

## 2021-03-01 NOTE — Progress Notes (Signed)
The patient is a 60 year old non-English speaking Hispanic gentleman very well-known to me.  I have been seeing him for a long period of time and he has worsening arthritis with his right knee.  He has tried and failed all forms of conservative treatment including activity modification, anti-inflammatories, numerous aspirations and steroid injections, hyaluronic acid and even arthroscopic surgery on his right knee.  He has gotten worse in terms of the pain with his knee.  At this point it is 10 out of 10 pain and detriment affecting his mobility, his quality of life and his actives of daily living.  He has tried and failed conservative treatment for well over 12 months.  He has had no other acute or active changes in medical status.  At this point with the failure of all conservative treatment measures including even arthroscopic surgery combined with the severe arthritis in his right knee, he would like to proceed with knee replacement surgery and I agree with this as well.  His right knee today does show moderate effusion.  There is varus malalignment and painful arc of motion of the knee.  It is ligamentously stable.  I did go over in length in detail knee replacement surgery showing him a knee replacement model and explaining in detail the risks and benefits of surgery as well as what to expect to an intraoperative and postoperative course.  He is interested in having this scheduled at this point and I agree as well.  We will work on getting it set up and scheduled.  We will be in touch.  All questions and concerns were answered and addressed to the interpreter.  I will send in some tramadol to have occasionally for pain as well.

## 2021-03-23 ENCOUNTER — Telehealth: Payer: Self-pay

## 2021-03-23 NOTE — Telephone Encounter (Signed)
Called patient to discuss scheduling surgery.  Used [employer] to make phone call.  Interpreter attempted to call patient and no answer, no voice mail.  Will try again later.

## 2021-05-30 ENCOUNTER — Other Ambulatory Visit: Payer: Self-pay | Admitting: Physician Assistant

## 2021-05-30 DIAGNOSIS — M1711 Unilateral primary osteoarthritis, right knee: Secondary | ICD-10-CM

## 2021-06-12 NOTE — Patient Instructions (Signed)
DUE TO COVID-19 ONLY ONE VISITOR IS ALLOWED TO COME WITH YOU AND STAY IN THE WAITING ROOM ONLY DURING PRE OP AND PROCEDURE DAY OF SURGERY IF YOU ARE GOING HOME AFTER SURGERY. IF YOU ARE SPENDING THE NIGHT 2 PEOPLE MAY VISIT WITH YOU IN YOUR PRIVATE ROOM AFTER SURGERY UNTIL VISITING  HOURS ARE OVER AT 800 PM AND 1  VISITOR  MAY  SPEND THE NIGHT.   YOU NEED TO HAVE A COVID 19 TEST ON_12/7_____THIS TEST MUST BE DONE BEFORE SURGERY,  COVID TESTING SITE  IS LOCATED AT [address], [place]. REMAIN IN YOUR CAR THIS IS A DRIVE UP TEST. AFTER YOUR COVID TEST PLEASE WEAR A MASK OUT IN PUBLIC AND SOCIAL DISTANCE AND WASH YOUR HANDS FREQUENTLY, ALSO ASK ALL YOUR CLOSE CONTACT PERSONS TO WEAR A MASK AND SOCIAL DISTANCE AND WASH THEIR HANDS FREQUENTLY ALSO.               Nathaniel Bailey     Your procedure is scheduled on: 06/16/21   Report to [facility] Main  Entrance   Report to admitting at   8:15 AM     Call this number if you have problems the morning of surgery [phone]    No food after midnight.    You may have clear liquid until 8:00 AM.    At 7:30 AM drink pre surgery drink.   Nothing by mouth after 8:00 AM.   CLEAR LIQUID DIET   Foods Allowed                                                                     Foods Excluded  Coffee and tea, regular and decaf                             liquids that you cannot  Plain Jell-O any favor except red or purple                                           see through such as: Fruit ices (not with fruit pulp)                                     milk, soups, orange juice  Iced Popsicles                                    All solid food Carbonated beverages, regular and diet                                    Cranberry, grape and apple juices Sports drinks like Gatorade Lightly seasoned clear broth or consume(fat free) Sugar     BRUSH YOUR TEETH MORNING OF SURGERY AND RINSE YOUR MOUTH OUT, NO CHEWING GUM  CANDY OR MINTS.     Take these medicines the morning of  surgery with A SIP OF WATER: Pantoprazole                                You may not have any metal on your body including              piercings  Do not wear jewelry, lotions, powders or  deodorant              Men may shave face and neck.   Do not bring valuables to the hospital. [place] IS NOT             RESPONSIBLE   FOR VALUABLES.  Contacts, dentures or bridgework may not be worn into surgery.  Leave suitcase in the car. After surgery it may be brought to your room.              [place] - Preparing for Surgery Before surgery, you can play an important role.  Because skin is not sterile, your skin needs to be as free of germs as possible.  You can reduce the number of germs on your skin by washing with CHG (chlorahexidine gluconate) soap before surgery.  CHG is an antiseptic cleaner which kills germs and bonds with the skin to continue killing germs even after washing. Please DO NOT use if you have an allergy to CHG or antibacterial soaps.  If your skin becomes reddened/irritated stop using the CHG and inform your nurse when you arrive at Short Stay.  You may shave your face/neck. Please follow these instructions carefully:  1.  Shower with CHG Soap the night before surgery and the  morning of Surgery.  2.  If you choose to wash your hair, wash your hair first as usual with your  normal  shampoo.  3.  After you shampoo, rinse your hair and body thoroughly to remove the  shampoo.                                      4.  Use CHG as you would any other liquid soap.  You can apply chg directly  to the skin and wash                       Gently with a scrungie or clean washcloth.  5.  Apply the CHG Soap to your body ONLY FROM THE NECK DOWN.   Do not use on face/ open                           Wound or open sores. Avoid contact with eyes, ears mouth and genitals (private parts).                       Wash face,  Genitals  (private parts) with your normal soap.             6.  Wash thoroughly, paying special attention to the area where your surgery  will be performed.  7.  Thoroughly rinse your body with warm water from the neck down.  8.  DO NOT shower/wash with your normal soap after using and rinsing off  the CHG Soap.                9.  Pat yourself dry  with a clean towel.            10.  Wear clean pajamas.            11.  Place clean sheets on your bed the night of your first shower and do not  sleep with pets. Day of Surgery : Do not apply any lotions/deodorants the morning of surgery.  Please wear clean clothes to the hospital/surgery center.  FAILURE TO FOLLOW THESE INSTRUCTIONS MAY RESULT IN THE CANCELLATION OF YOUR SURGERY PATIENT SIGNATURE_________________________________  NURSE SIGNATURE__________________________________  ________________________________________________________________________

## 2021-06-13 ENCOUNTER — Encounter (HOSPITAL_COMMUNITY)
Admission: RE | Admit: 2021-06-13 | Discharge: 2021-06-13 | Disposition: A | Discharge status: Home / Self Care | Payer: Self-pay | Source: Ambulatory Visit | Attending: Orthopaedic Surgery | Admitting: Orthopaedic Surgery

## 2021-06-13 ENCOUNTER — Encounter (HOSPITAL_COMMUNITY): Payer: Self-pay

## 2021-06-13 ENCOUNTER — Encounter (HOSPITAL_COMMUNITY): Payer: Self-pay | Admitting: Physician Assistant

## 2021-06-13 ENCOUNTER — Telehealth: Payer: Self-pay

## 2021-06-13 ENCOUNTER — Other Ambulatory Visit: Payer: Self-pay

## 2021-06-13 DIAGNOSIS — R7303 Prediabetes: Secondary | ICD-10-CM | POA: Insufficient documentation

## 2021-06-13 DIAGNOSIS — Z01818 Encounter for other preprocedural examination: Secondary | ICD-10-CM | POA: Insufficient documentation

## 2021-06-13 DIAGNOSIS — M1711 Unilateral primary osteoarthritis, right knee: Secondary | ICD-10-CM | POA: Insufficient documentation

## 2021-06-13 LAB — HEMOGLOBIN A1C
Hgb A1c MFr Bld: 7.8 % — ABNORMAL HIGH (ref 4.8–5.6)
Mean Plasma Glucose: 177.16 mg/dL

## 2021-06-13 LAB — GLUCOSE, CAPILLARY: Glucose-Capillary: 244 mg/dL — ABNORMAL HIGH (ref 70–99)

## 2021-06-13 LAB — CBC
HCT: 47.2 % (ref 39.0–52.0)
Hemoglobin: 16.2 g/dL (ref 13.0–17.0)
MCH: 29.3 pg (ref 26.0–34.0)
MCHC: 34.3 g/dL (ref 30.0–36.0)
MCV: 85.4 fL (ref 80.0–100.0)
Platelets: 187 10*3/uL (ref 150–400)
RBC: 5.53 MIL/uL (ref 4.22–5.81)
RDW: 13.2 % (ref 11.5–15.5)
WBC: 5 10*3/uL (ref 4.0–10.5)
nRBC: 0 % (ref 0.0–0.2)

## 2021-06-13 LAB — BASIC METABOLIC PANEL
Anion gap: 8 (ref 5–15)
BUN: 18 mg/dL (ref 6–20)
CO2: 24 mmol/L (ref 22–32)
Calcium: 9.3 mg/dL (ref 8.9–10.3)
Chloride: 104 mmol/L (ref 98–111)
Creatinine, Ser: 0.78 mg/dL (ref 0.61–1.24)
GFR, Estimated: >60 mL/min (ref >60)
Glucose, Bld: 220 mg/dL — ABNORMAL HIGH (ref 70–99)
Potassium: 4.1 mmol/L (ref 3.5–5.1)
Sodium: 136 mmol/L (ref 135–145)

## 2021-06-13 LAB — SURGICAL PCR SCREEN
MRSA, PCR: NEGATIVE
Staphylococcus aureus: NEGATIVE

## 2021-06-13 NOTE — Telephone Encounter (Signed)
I called patient using Landscape architect.  Advised patient that A1c too high for surgery needs follow up with PCP.  Surgery cancelled for 06-16-21.  Patient does not have PCP.  I advised I would enter referral for [facility] so he can establish for Primary Care and get this addressed.  Once A1c lower, we can reschedule surgery.

## 2021-06-13 NOTE — Telephone Encounter (Signed)
-----   Message from Kathryne Hitch, MD sent at 06/13/2021 12:40 PM EST ----- Unfortunately, his Hgb A1C is 7.8.  he will need to be cancelled until his blood glucose is under better control. ----- Message ----- From: Interface, Lab In [place] Sent: 06/13/2021  11:45 AM EST To: Kathryne Hitch, MD

## 2021-06-13 NOTE — Progress Notes (Signed)
COVID test- 12/7  PCP - none Cardiologist -   Chest x-ray - no EKG - 06/13/21-chart Stress Test - no ECHO - no Cardiac Cath - no Pacemaker/ICD device last checked:NA  Sleep Study - no CPAP -   Fasting Blood Sugar - doesn't test and denies being pre-diabetic Checks Blood Sugar _____ times a day  Blood Thinner Instructions:NA Aspirin Instructions: Last Dose:  Anesthesia review: yes  Patient denies shortness of breath, fever, cough and chest pain at PAT appointment Pt denies having high blood pressure. His BP was 165/100 and 169/100 at the PAT visit.CBG was 244. Pt did have breakfast.  Patient verbalized understanding of instructions that were given to them at the PAT appointment. Patient was also instructed that they will need to review over the PAT instructions again at home before surgery. Pt had an interpreter volunteer at the PAT visit. He has a friend who isn't able to speak but uses a cell phone to communicate. He is fluent in [place] and able to review the instructions.

## 2021-06-16 ENCOUNTER — Ambulatory Visit (HOSPITAL_COMMUNITY): Admission: RE | Admit: 2021-06-16 | Payer: Self-pay | Source: Ambulatory Visit | Admitting: Orthopaedic Surgery

## 2021-06-16 ENCOUNTER — Encounter (HOSPITAL_COMMUNITY): Admission: RE | Payer: Self-pay | Source: Ambulatory Visit

## 2021-06-16 LAB — TYPE AND SCREEN
ABO/RH(D): O POS
Antibody Screen: NEGATIVE

## 2021-06-16 SURGERY — ARTHROPLASTY, KNEE, TOTAL
Anesthesia: Spinal | Site: Knee | Laterality: Right

## 2021-06-29 ENCOUNTER — Encounter: Payer: Self-pay | Admitting: Physician Assistant

## 2021-07-17 ENCOUNTER — Telehealth: Payer: Self-pay

## 2021-07-17 NOTE — Telephone Encounter (Signed)
Patient called. States his SU was Cx due to his BP being high. States he is doing better. Saw PCP and was given meds to control. He takes amlodipine and metformin. Would like to know when he can R/S his SU.    CB [phone]

## 2021-07-20 NOTE — Telephone Encounter (Signed)
Marisue Ivan called patient with me.  He stated he went to [facility] in [facility] for diabetes.  Will call them to get current A1c and will let patient know about rescheduling surgery.

## 2021-08-08 ENCOUNTER — Other Ambulatory Visit: Payer: Self-pay

## 2021-08-08 ENCOUNTER — Ambulatory Visit (INDEPENDENT_AMBULATORY_CARE_PROVIDER_SITE_OTHER): Payer: Self-pay | Admitting: Orthopaedic Surgery

## 2021-08-08 DIAGNOSIS — R7309 Other abnormal glucose: Secondary | ICD-10-CM

## 2021-08-08 NOTE — Progress Notes (Signed)
A1C

## 2021-08-09 LAB — HEMOGLOBIN A1C
Hgb A1c MFr Bld: 6.6 % of total Hgb — ABNORMAL HIGH (ref <5.7)
Mean Plasma Glucose: 143 mg/dL
eAG (mmol/L): 7.9 mmol/L

## 2021-08-23 ENCOUNTER — Other Ambulatory Visit: Payer: Self-pay

## 2021-09-08 NOTE — Progress Notes (Signed)
Sent message, via epic in basket, requesting orders in epic from surgeon.  

## 2021-09-19 ENCOUNTER — Other Ambulatory Visit: Payer: Self-pay | Admitting: Physician Assistant

## 2021-09-19 DIAGNOSIS — M1711 Unilateral primary osteoarthritis, right knee: Secondary | ICD-10-CM

## 2021-09-20 NOTE — Progress Notes (Addendum)
Anesthesia Review: ? ?PCP: [facility] in [place]  ?Cardiologist : none  ?Chest x-ray : ?EKG :06/13/21  ?Echo : ?Stress test: ?Cardiac Cath :  ?Activity level: can do a flight of stiars without difficulty  ?Sleep Study/ CPAP : none  ?Fasting Blood Sugar :      / Checks Blood Sugar -- times a day:   ?Blood Thinner/ Instructions /Last Dose: ?ASA / Instructions/ Last Dose :  \ ?DM- type - prediabetes- does not check glucose at home  ?Hgba1c-08/08/21- 6.6  ?PT cale on 09/22/21 for preop appt.  Interpreter, Byrd Hesselbach , present at preop appt.  Interpreter stated that pt's friend . Bethanie Dicker who will be picking him up upon discharge is deaf, mute.  PT was not very forthcoming with info.   ?All questions answered for pt at preop.  PT wanted to know at proep if he should continue his current meds of amlodipine and metformin until surgery.  Nurse informed pt that he should continue meds as prescribed by MD until surgery unless  instructed by MD to discontinue.  PT instructed to call [facility] and inform them he is having surgery on 09/29/21 and see them prior to surgery.  IF they want to make any changes is medicaitons to follow their instructions.  Medical hx and preop instrucitons were reviewed at preop.  PT voiced understanding  PT aware to take Amlodipine am of surgery.   ?Surgery cancelled on 06/16/21.  ?

## 2021-09-20 NOTE — Progress Notes (Signed)
DUE TO COVID-19 ONLY ONE VISITOR IS ALLOWED TO COME WITH YOU AND STAY IN THE WAITING ROOM ONLY DURING PRE OP AND PROCEDURE DAY OF SURGERY.  2 VISITOR  MAY VISIT WITH YOU AFTER SURGERY IN YOUR PRIVATE ROOM DURING VISITING HOURS ONLY! ?YOU MAY HAVE ONE PERSON SPEND THE NITE WITH YOU IN YOUR ROOM AFTER SURGERY.   ? ? ? ? Your procedure is scheduled on:  ?                          09/29/21.  ? Report to [facility] Main  Entrance ? ? Report to admitting at   0815              AM ?DO NOT BRING INSURANCE CARD, PICTURE ID OR WALLET DAY OF SURGERY.  ?  ? ? Call this number if you have problems the morning of surgery [phone]  ? ? REMEMBER: NO  SOLID FOODS , CANDY, GUM OR MINTS AFTER MIDNITE THE NITE BEFORE SURGERY .       Marland Kitchen CLEAR LIQUIDS UNTIL        0800am         DAY OF SURGERY.      PLEASE FINISH ENSURE DRINK PER SURGEON ORDER  WHICH NEEDS TO BE COMPLETED AT     0800am       MORNING OF SURGERY.   ? ? ? ? ?CLEAR LIQUID DIET ? ? ?Foods Allowed      ?WATER ?BLACK COFFEE ( SUGAR OK, NO MILK, CREAM OR CREAMER) REGULAR AND DECAF  ?TEA ( SUGAR OK NO MILK, CREAM, OR CREAMER) REGULAR AND DECAF  ?PLAIN JELLO ( NO RED)  ?FRUIT ICES ( NO RED, NO FRUIT PULP)  ?POPSICLES ( NO RED)  ?JUICE- APPLE, WHITE GRAPE AND WHITE CRANBERRY  ?SPORT DRINK LIKE GATORADE ( NO RED)  ?CLEAR BROTH ( VEGETABLE , CHICKEN OR BEEF)                                                               ? ?    ? ?BRUSH YOUR TEETH MORNING OF SURGERY AND RINSE YOUR MOUTH OUT, NO CHEWING GUM CANDY OR MINTS. ?  ? ? Take these medicines the morning of surgery with A SIP OF WATER:  amlodipine, protonix  ? ? ?DO NOT TAKE ANY DIABETIC MEDICATIONS DAY OF YOUR SURGERY ?                  ?            You may not have any metal on your body including hair pins and  ?            piercings  Do not wear jewelry, make-up, lotions, powders or perfumes, deodorant ?            Do not wear nail polish on your fingernails.   ?           IF YOU ARE A MALE AND WANT TO SHAVE  UNDER ARMS OR LEGS PRIOR TO SURGERY YOU MUST DO SO AT LEAST 48 HOURS PRIOR TO SURGERY.  ?            Men may shave face and neck. ? ?  Do not bring valuables to the hospital. [place] [facility] IS NOT ?            RESPONSIBLE   FOR VALUABLES. ? Contacts, dentures or bridgework may not be worn into surgery. ? Leave suitcase in the car. After surgery it may be brought to your room. ? ?  ? Patients discharged the day of surgery will not be allowed to drive home. IF YOU ARE HAVING SURGERY AND GOING HOME THE SAME DAY, YOU MUST HAVE AN ADULT TO DRIVE YOU HOME AND BE WITH YOU FOR 24 HOURS. YOU MAY GO HOME BY TAXI OR UBER OR ORTHERWISE, BUT AN ADULT MUST ACCOMPANY YOU HOME AND STAY WITH YOU FOR 24 HOURS. ?  ? ?            Please read over the following fact sheets you were given: ?_____________________________________________________________________ ? ?[place] - Preparing for Surgery ?Before surgery, you can play an important role.  Because skin is not sterile, your skin needs to be as free of germs as possible.  You can reduce the number of germs on your skin by washing with CHG (chlorahexidine gluconate) soap before surgery.  CHG is an antiseptic cleaner which kills germs and bonds with the skin to continue killing germs even after washing. ?Please DO NOT use if you have an allergy to CHG or antibacterial soaps.  If your skin becomes reddened/irritated stop using the CHG and inform your nurse when you arrive at Short Stay. ?Do not shave (including legs and underarms) for at least 48 hours prior to the first CHG shower.  You may shave your face/neck. ?Please follow these instructions carefully: ? 1.  Shower with CHG Soap the night before surgery and the  morning of Surgery. ? 2.  If you choose to wash your hair, wash your hair first as usual with your  normal  shampoo. ? 3.  After you shampoo, rinse your hair and body thoroughly to remove the  shampoo.                           4.  Use CHG as you would any other liquid soap.   You can apply chg directly  to the skin and wash  ?                     Gently with a scrungie or clean washcloth. ? 5.  Apply the CHG Soap to your body ONLY FROM THE NECK DOWN.   Do not use on face/ open      ?                     Wound or open sores. Avoid contact with eyes, ears mouth and genitals (private parts).  ?                     Engineering geologist,  Genitals (private parts) with your normal soap. ?            6.  Wash thoroughly, paying special attention to the area where your surgery  will be performed. ? 7.  Thoroughly rinse your body with warm water from the neck down. ? 8.  DO NOT shower/wash with your normal soap after using and rinsing off  the CHG Soap. ?               9.  Pat yourself dry with a clean towel. ?  10.  Wear clean pajamas. ?           11.  Place clean sheets on your bed the night of your first shower and do not  sleep with pets. ?Day of Surgery : ?Do not apply any lotions/deodorants the morning of surgery.  Please wear clean clothes to the hospital/surgery center. ? ?FAILURE TO FOLLOW THESE INSTRUCTIONS MAY RESULT IN THE CANCELLATION OF YOUR SURGERY ?PATIENT SIGNATURE_________________________________ ? ?NURSE SIGNATURE__________________________________ ? ?________________________________________________________________________  ? ? ?           ?

## 2021-09-22 ENCOUNTER — Encounter (HOSPITAL_COMMUNITY)
Admission: RE | Admit: 2021-09-22 | Discharge: 2021-09-22 | Disposition: A | Discharge status: Home / Self Care | Payer: Self-pay | Source: Ambulatory Visit | Attending: Orthopaedic Surgery | Admitting: Orthopaedic Surgery

## 2021-09-22 ENCOUNTER — Encounter (HOSPITAL_COMMUNITY): Payer: Self-pay

## 2021-09-22 ENCOUNTER — Other Ambulatory Visit: Payer: Self-pay

## 2021-09-22 DIAGNOSIS — M1711 Unilateral primary osteoarthritis, right knee: Secondary | ICD-10-CM | POA: Insufficient documentation

## 2021-09-22 DIAGNOSIS — Z01812 Encounter for preprocedural laboratory examination: Secondary | ICD-10-CM | POA: Insufficient documentation

## 2021-09-22 LAB — CBC
HCT: 46.9 % (ref 39.0–52.0)
Hemoglobin: 15.9 g/dL (ref 13.0–17.0)
MCH: 29.4 pg (ref 26.0–34.0)
MCHC: 33.9 g/dL (ref 30.0–36.0)
MCV: 86.7 fL (ref 80.0–100.0)
Platelets: 204 10*3/uL (ref 150–400)
RBC: 5.41 MIL/uL (ref 4.22–5.81)
RDW: 13.2 % (ref 11.5–15.5)
WBC: 5.3 10*3/uL (ref 4.0–10.5)
nRBC: 0 % (ref 0.0–0.2)

## 2021-09-22 LAB — BASIC METABOLIC PANEL
Anion gap: 5 (ref 5–15)
BUN: 12 mg/dL (ref 6–20)
CO2: 26 mmol/L (ref 22–32)
Calcium: 8.9 mg/dL (ref 8.9–10.3)
Chloride: 103 mmol/L (ref 98–111)
Creatinine, Ser: 0.66 mg/dL (ref 0.61–1.24)
GFR, Estimated: >60 mL/min (ref >60)
Glucose, Bld: 121 mg/dL — ABNORMAL HIGH (ref 70–99)
Potassium: 4 mmol/L (ref 3.5–5.1)
Sodium: 134 mmol/L — ABNORMAL LOW (ref 135–145)

## 2021-09-22 LAB — SURGICAL PCR SCREEN
MRSA, PCR: NEGATIVE
Staphylococcus aureus: NEGATIVE

## 2021-09-22 LAB — GLUCOSE, CAPILLARY: Glucose-Capillary: 107 mg/dL — ABNORMAL HIGH (ref 70–99)

## 2021-09-28 NOTE — H&P (Signed)
TOTAL KNEE ADMISSION H&P ? ?Patient is being admitted for right total knee arthroplasty. ? ?Subjective: ? ?Chief Complaint:right knee pain. ? ?HPI: Kabel Eyles, 61 y.o. male, has a history of pain and functional disability in the right knee due to arthritis and has failed non-surgical conservative treatments for greater than 12 weeks to includeNSAID's and/or analgesics, corticosteriod injections, viscosupplementation injections, flexibility and strengthening excercises, use of assistive devices, and activity modification.  Onset of symptoms was gradual, starting 5 years ago with gradually worsening course since that time. The patient noted prior procedures on the knee to include  arthroscopy and menisectomy on the right knee(s).  Patient currently rates pain in the right knee(s) at 10 out of 10 with activity. Patient has night pain, worsening of pain with activity and weight bearing, pain that interferes with activities of daily living, pain with passive range of motion, crepitus, and joint swelling.  Patient has evidence of subchondral sclerosis, periarticular osteophytes, and joint space narrowing by imaging studies. There is no active infection. ? ?Patient Active Problem List  ? Diagnosis Date Noted  ? Unilateral primary osteoarthritis, right knee 08/31/2020  ? Status post arthroscopy of left knee 04/05/2020  ? Other tear of medial meniscus, current injury, left knee, subsequent encounter 03/29/2020  ? Status post arthroscopy of right knee 10/28/2017  ? Medial meniscus, posterior horn derangement, right 10/17/2017  ? ?Past Medical History:  ?Diagnosis Date  ? Arthritis   ? Headache   ? Hypertension   ? NO MEDS , patient reports sometimes elevated   ? Pre-diabetes   ? Pt denies. A1c was 7.8 06/13/21.  ?  ?Past Surgical History:  ?Procedure Laterality Date  ? KNEE ARTHROSCOPY Left 03/29/2020  ? Procedure: LEFT KNEE ARTHROSCOPY WITH DEBRIDEMENT AND PARTIAL MEDIAL MENISCECTOMY;  Surgeon: Mcarthur Rossetti, MD;  Location: [place];  Service: Orthopedics;  Laterality: Left;  ? KNEE ARTHROSCOPY WITH MEDIAL MENISECTOMY Right 10/17/2017  ? Procedure: RIGHT KNEE ARTHROSCOPY WITH PARTIAL MEDIAL MENISCECTOMY AND DEBRIDEMENT;  Surgeon: Mcarthur Rossetti, MD;  Location: [facility];  Service: Orthopedics;  Laterality: Right;  ?  ?No current facility-administered medications for this encounter.  ? ?Current Outpatient Medications  ?Medication Sig Dispense Refill Last Dose  ? amLODipine (NORVASC) 5 MG tablet Take 5 mg by mouth daily.     ? metFORMIN (GLUCOPHAGE) 500 MG tablet Take 500 mg by mouth 2 (two) times daily.     ? acetaminophen-codeine (TYLENOL #3) 300-30 MG tablet Take 1-2 tablets by mouth every 8 (eight) hours as needed. (Patient not taking: Reported on 09/19/2021) 30 tablet 0 Not Taking  ? HYDROcodone-acetaminophen (NORCO/VICODIN) 5-325 MG tablet Take 1 tablet by mouth every 6 (six) hours as needed for moderate pain. (Patient not taking: Reported on 09/19/2021) 30 tablet 0 Not Taking  ? ibuprofen (ADVIL,MOTRIN) 600 MG tablet Take 1 tablet (600 mg total) by mouth every 6 (six) hours as needed for mild pain or moderate pain. (Patient not taking: Reported on 09/19/2021) 30 tablet 0 Not Taking  ? nabumetone (RELAFEN) 750 MG tablet Take 1 tablet (750 mg total) by mouth 2 (two) times daily as needed. (Patient not taking: Reported on 09/19/2021) 60 tablet 3 Not Taking  ? ondansetron (ZOFRAN ODT) 4 MG disintegrating tablet Take 1 tablet (4 mg total) by mouth every 8 (eight) hours as needed for nausea or vomiting. (Patient not taking: Reported on 09/19/2021) 20 tablet 0 Not Taking  ? pantoprazole (PROTONIX) 40 MG tablet Take by mouth.     ? ?  No Known Allergies  ?Social History  ? ?Tobacco Use  ? Smoking status: Never  ? Smokeless tobacco: Never  ?Substance Use Topics  ? Alcohol use: Not Currently  ?  ?No family history on file.  ? ?Review of Systems  ?Musculoskeletal:  Positive for joint swelling.   ?All other systems reviewed and are negative. ? ?Objective: ? ?Physical Exam ?Vitals reviewed.  ?Constitutional:   ?   Appearance: Normal appearance.  ?HENT:  ?   Head: Normocephalic and atraumatic.  ?Eyes:  ?   Extraocular Movements: Extraocular movements intact.  ?   Pupils: Pupils are equal, round, and reactive to light.  ?Cardiovascular:  ?   Rate and Rhythm: Normal rate and regular rhythm.  ?Pulmonary:  ?   Effort: Pulmonary effort is normal.  ?   Breath sounds: Normal breath sounds.  ?Abdominal:  ?   Palpations: Abdomen is soft.  ?Musculoskeletal:  ?   Cervical back: Normal range of motion and neck supple.  ?   Right knee: Effusion, bony tenderness and crepitus present. Decreased range of motion. Tenderness present over the medial joint line, lateral joint line and patellar tendon. Abnormal alignment and abnormal meniscus.  ?Neurological:  ?   Mental Status: He is alert and oriented to person, place, and time.  ?Psychiatric:     ?   Behavior: Behavior normal.  ? ? ?Vital signs in last 24 hours: ?  ? ?Labs: ? ? ?Estimated body mass index is 25.46 kg/m? as calculated from the following: ?  Height as of this encounter: 5\' 5"  (1.651 m). ?  Weight as of this encounter: 69.4 kg. ? ? ?Imaging Review ?Plain radiographs demonstrate severe degenerative joint disease of the right knee(s). The overall alignment ismild varus. The bone quality appears to be excellent for age and reported activity level. ? ? ? ? ? ?Assessment/Plan: ? ?End stage arthritis, right knee  ? ?The patient history, physical examination, clinical judgment of the provider and imaging studies are consistent with end stage degenerative joint disease of the right knee(s) and total knee arthroplasty is deemed medically necessary. The treatment options including medical management, injection therapy arthroscopy and arthroplasty were discussed at length. The risks and benefits of total knee arthroplasty were presented and reviewed. The risks due to aseptic  loosening, infection, stiffness, patella tracking problems, thromboembolic complications and other imponderables were discussed. The patient acknowledged the explanation, agreed to proceed with the plan and consent was signed. Patient is being admitted for inpatient treatment for surgery, pain control, PT, OT, prophylactic antibiotics, VTE prophylaxis, progressive ambulation and ADL's and discharge planning. The patient is planning to be discharged home with home health services ? ? ? ? ?

## 2021-09-29 ENCOUNTER — Ambulatory Visit (HOSPITAL_BASED_OUTPATIENT_CLINIC_OR_DEPARTMENT_OTHER): Payer: Self-pay | Admitting: Registered Nurse

## 2021-09-29 ENCOUNTER — Encounter (HOSPITAL_COMMUNITY): Payer: Self-pay | Admitting: Orthopaedic Surgery

## 2021-09-29 ENCOUNTER — Inpatient Hospital Stay (HOSPITAL_COMMUNITY)
Admission: RE | Admit: 2021-09-29 | Discharge: 2021-10-03 | DRG: 470 | Disposition: A | Discharge status: Home Health | Payer: Self-pay | Attending: Orthopaedic Surgery | Admitting: Orthopaedic Surgery

## 2021-09-29 ENCOUNTER — Ambulatory Visit (HOSPITAL_COMMUNITY): Payer: Self-pay | Admitting: Physician Assistant

## 2021-09-29 ENCOUNTER — Encounter (HOSPITAL_COMMUNITY)
Admission: RE | Disposition: A | Discharge status: Home Health | Payer: Self-pay | Source: Home / Self Care | Attending: Orthopaedic Surgery

## 2021-09-29 ENCOUNTER — Observation Stay (HOSPITAL_COMMUNITY): Payer: Self-pay

## 2021-09-29 ENCOUNTER — Other Ambulatory Visit: Payer: Self-pay

## 2021-09-29 DIAGNOSIS — M25761 Osteophyte, right knee: Secondary | ICD-10-CM | POA: Diagnosis present

## 2021-09-29 DIAGNOSIS — R7303 Prediabetes: Secondary | ICD-10-CM | POA: Diagnosis present

## 2021-09-29 DIAGNOSIS — M1711 Unilateral primary osteoarthritis, right knee: Secondary | ICD-10-CM

## 2021-09-29 DIAGNOSIS — Z7984 Long term (current) use of oral hypoglycemic drugs: Secondary | ICD-10-CM

## 2021-09-29 DIAGNOSIS — Z96651 Presence of right artificial knee joint: Principal | ICD-10-CM

## 2021-09-29 DIAGNOSIS — Z79899 Other long term (current) drug therapy: Secondary | ICD-10-CM

## 2021-09-29 DIAGNOSIS — I10 Essential (primary) hypertension: Secondary | ICD-10-CM | POA: Diagnosis present

## 2021-09-29 HISTORY — PX: TOTAL KNEE ARTHROPLASTY: SHX125

## 2021-09-29 LAB — TYPE AND SCREEN
ABO/RH(D): O POS
Antibody Screen: NEGATIVE

## 2021-09-29 LAB — GLUCOSE, CAPILLARY: Glucose-Capillary: 130 mg/dL — ABNORMAL HIGH (ref 70–99)

## 2021-09-29 SURGERY — ARTHROPLASTY, KNEE, TOTAL
Anesthesia: Monitor Anesthesia Care | Site: Knee | Laterality: Right

## 2021-09-29 MED ORDER — ROPIVACAINE HCL 7.5 MG/ML IJ SOLN
INTRAMUSCULAR | Status: DC | PRN
  Administered 2021-09-29: 20 mL via PERINEURAL

## 2021-09-29 MED ORDER — ACETAMINOPHEN 160 MG/5ML PO SOLN: 325.0000 mg | ORAL | Status: DC | PRN

## 2021-09-29 MED ORDER — METOCLOPRAMIDE HCL 5 MG/ML IJ SOLN: 5.0000 mg | Freq: Three times a day (TID) | INTRAMUSCULAR | Status: DC | PRN

## 2021-09-29 MED ORDER — MIDAZOLAM HCL 2 MG/2ML IJ SOLN
1.0000 mg | INTRAMUSCULAR | Status: DC
  Administered 2021-09-29: 2 mg via INTRAVENOUS
  Filled 2021-09-29: qty 2

## 2021-09-29 MED ORDER — PROPOFOL 1000 MG/100ML IV EMUL
INTRAVENOUS | Status: AC
  Filled 2021-09-29: qty 100

## 2021-09-29 MED ORDER — DEXAMETHASONE SODIUM PHOSPHATE 10 MG/ML IJ SOLN
INTRAMUSCULAR | Status: AC
  Filled 2021-09-29: qty 1

## 2021-09-29 MED ORDER — DIPHENHYDRAMINE HCL 12.5 MG/5ML PO ELIX: 12.5000 mg | ORAL_SOLUTION | ORAL | Status: DC | PRN

## 2021-09-29 MED ORDER — METHOCARBAMOL 500 MG PO TABS
500.0000 mg | ORAL_TABLET | Freq: Four times a day (QID) | ORAL | Status: DC | PRN
  Administered 2021-09-29 – 2021-10-03 (×8): 500 mg via ORAL
  Filled 2021-09-29 (×9): qty 1

## 2021-09-29 MED ORDER — FENTANYL CITRATE (PF) 100 MCG/2ML IJ SOLN
INTRAMUSCULAR | Status: DC | PRN
  Administered 2021-09-29 (×2): 50 ug via INTRAVENOUS

## 2021-09-29 MED ORDER — ACETAMINOPHEN 325 MG PO TABS: 325.0000 mg | ORAL_TABLET | ORAL | Status: DC | PRN

## 2021-09-29 MED ORDER — OXYCODONE HCL 5 MG PO TABS: 5.0000 mg | ORAL_TABLET | Freq: Once | ORAL | Status: DC | PRN

## 2021-09-29 MED ORDER — CEFAZOLIN SODIUM-DEXTROSE 1-4 GM/50ML-% IV SOLN
1.0000 g | Freq: Four times a day (QID) | INTRAVENOUS | Status: AC
  Administered 2021-09-29 (×2): 1 g via INTRAVENOUS
  Filled 2021-09-29 (×2): qty 50

## 2021-09-29 MED ORDER — MEPERIDINE HCL 50 MG/ML IJ SOLN: 6.2500 mg | INTRAMUSCULAR | Status: DC | PRN

## 2021-09-29 MED ORDER — SODIUM CHLORIDE 0.9 % IV SOLN: INTRAVENOUS | Status: DC

## 2021-09-29 MED ORDER — HYDROMORPHONE HCL 1 MG/ML IJ SOLN
0.5000 mg | INTRAMUSCULAR | Status: DC | PRN
  Filled 2021-09-29: qty 1

## 2021-09-29 MED ORDER — SODIUM CHLORIDE 0.9 % IR SOLN
Status: DC | PRN
  Administered 2021-09-29: 1000 mL

## 2021-09-29 MED ORDER — PANTOPRAZOLE SODIUM 40 MG PO TBEC
40.0000 mg | DELAYED_RELEASE_TABLET | Freq: Every day | ORAL | Status: DC
  Administered 2021-09-29 – 2021-10-03 (×5): 40 mg via ORAL
  Filled 2021-09-29 (×5): qty 1

## 2021-09-29 MED ORDER — MENTHOL 3 MG MT LOZG: 1.0000 | LOZENGE | OROMUCOSAL | Status: DC | PRN

## 2021-09-29 MED ORDER — 0.9 % SODIUM CHLORIDE (POUR BTL) OPTIME
TOPICAL | Status: DC | PRN
  Administered 2021-09-29: 1000 mL

## 2021-09-29 MED ORDER — AMLODIPINE BESYLATE 5 MG PO TABS
5.0000 mg | ORAL_TABLET | Freq: Every day | ORAL | Status: DC
  Administered 2021-09-29 – 2021-10-03 (×5): 5 mg via ORAL
  Filled 2021-09-29 (×5): qty 1

## 2021-09-29 MED ORDER — OXYCODONE HCL 5 MG PO TABS
10.0000 mg | ORAL_TABLET | ORAL | Status: DC | PRN
  Administered 2021-09-29 (×2): 15 mg via ORAL
  Administered 2021-09-30: 10 mg via ORAL
  Administered 2021-09-30 – 2021-10-01 (×3): 15 mg via ORAL
  Administered 2021-10-01: 10 mg via ORAL
  Administered 2021-10-01 – 2021-10-03 (×8): 15 mg via ORAL
  Filled 2021-09-29 (×8): qty 3
  Filled 2021-09-29: qty 2
  Filled 2021-09-29 (×5): qty 3

## 2021-09-29 MED ORDER — TRANEXAMIC ACID-NACL 1000-0.7 MG/100ML-% IV SOLN
1000.0000 mg | INTRAVENOUS | Status: AC
  Administered 2021-09-29: 1000 mg via INTRAVENOUS
  Filled 2021-09-29: qty 100

## 2021-09-29 MED ORDER — ALUM & MAG HYDROXIDE-SIMETH 200-200-20 MG/5ML PO SUSP: 30.0000 mL | ORAL | Status: DC | PRN

## 2021-09-29 MED ORDER — ONDANSETRON HCL 4 MG/2ML IJ SOLN
INTRAMUSCULAR | Status: AC
  Filled 2021-09-29: qty 2

## 2021-09-29 MED ORDER — LACTATED RINGERS IV SOLN
INTRAVENOUS | Status: DC
Start: 2021-09-29 — End: 2021-09-29

## 2021-09-29 MED ORDER — METHOCARBAMOL 500 MG IVPB - SIMPLE MED
500.0000 mg | Freq: Four times a day (QID) | INTRAVENOUS | Status: DC | PRN
  Filled 2021-09-29: qty 50

## 2021-09-29 MED ORDER — CEFAZOLIN SODIUM-DEXTROSE 2-4 GM/100ML-% IV SOLN
2.0000 g | INTRAVENOUS | Status: AC
  Administered 2021-09-29: 2 g via INTRAVENOUS
  Filled 2021-09-29: qty 100

## 2021-09-29 MED ORDER — FENTANYL CITRATE PF 50 MCG/ML IJ SOSY: 25.0000 ug | PREFILLED_SYRINGE | INTRAMUSCULAR | Status: DC | PRN

## 2021-09-29 MED ORDER — METFORMIN HCL 500 MG PO TABS
500.0000 mg | ORAL_TABLET | Freq: Two times a day (BID) | ORAL | Status: DC
  Administered 2021-09-29 – 2021-10-03 (×8): 500 mg via ORAL
  Filled 2021-09-29 (×8): qty 1

## 2021-09-29 MED ORDER — FENTANYL CITRATE (PF) 100 MCG/2ML IJ SOLN
INTRAMUSCULAR | Status: AC
  Filled 2021-09-29: qty 2

## 2021-09-29 MED ORDER — ONDANSETRON HCL 4 MG PO TABS
4.0000 mg | ORAL_TABLET | Freq: Four times a day (QID) | ORAL | Status: DC | PRN
  Administered 2021-10-02 – 2021-10-03 (×3): 4 mg via ORAL
  Filled 2021-09-29 (×3): qty 1

## 2021-09-29 MED ORDER — CHLORHEXIDINE GLUCONATE 0.12 % MT SOLN: 15.0000 mL | Freq: Once | OROMUCOSAL | Status: DC

## 2021-09-29 MED ORDER — POVIDONE-IODINE 10 % EX SWAB: 2.0000 "application " | Freq: Once | CUTANEOUS | Status: DC

## 2021-09-29 MED ORDER — STERILE WATER FOR IRRIGATION IR SOLN
Status: DC | PRN
  Administered 2021-09-29: 2000 mL

## 2021-09-29 MED ORDER — FENTANYL CITRATE PF 50 MCG/ML IJ SOSY
50.0000 ug | PREFILLED_SYRINGE | INTRAMUSCULAR | Status: DC
  Administered 2021-09-29: 50 ug via INTRAVENOUS
  Filled 2021-09-29: qty 2

## 2021-09-29 MED ORDER — PHENYLEPHRINE HCL-NACL 20-0.9 MG/250ML-% IV SOLN
INTRAVENOUS | Status: DC | PRN
Start: 2021-09-29 — End: 2021-09-29
  Administered 2021-09-29: 20 ug/min via INTRAVENOUS

## 2021-09-29 MED ORDER — ONDANSETRON HCL 4 MG/2ML IJ SOLN
4.0000 mg | Freq: Four times a day (QID) | INTRAMUSCULAR | Status: DC | PRN
  Administered 2021-09-30 – 2021-10-01 (×2): 4 mg via INTRAVENOUS
  Filled 2021-09-29 (×2): qty 2

## 2021-09-29 MED ORDER — ONDANSETRON HCL 4 MG/2ML IJ SOLN: 4.0000 mg | Freq: Once | INTRAMUSCULAR | Status: DC | PRN

## 2021-09-29 MED ORDER — METOCLOPRAMIDE HCL 5 MG PO TABS: 5.0000 mg | ORAL_TABLET | Freq: Three times a day (TID) | ORAL | Status: DC | PRN

## 2021-09-29 MED ORDER — ASPIRIN 81 MG PO CHEW
81.0000 mg | CHEWABLE_TABLET | Freq: Two times a day (BID) | ORAL | Status: DC
  Administered 2021-09-29 – 2021-10-03 (×8): 81 mg via ORAL
  Filled 2021-09-29 (×8): qty 1

## 2021-09-29 MED ORDER — KETOROLAC TROMETHAMINE 15 MG/ML IJ SOLN
7.5000 mg | Freq: Four times a day (QID) | INTRAMUSCULAR | Status: AC
Start: 2021-09-29 — End: 2021-09-30
  Administered 2021-09-29 – 2021-09-30 (×3): 7.5 mg via INTRAVENOUS
  Filled 2021-09-29 (×3): qty 1

## 2021-09-29 MED ORDER — DEXAMETHASONE SODIUM PHOSPHATE 10 MG/ML IJ SOLN
INTRAMUSCULAR | Status: DC | PRN
Start: 2021-09-29 — End: 2021-09-29
  Administered 2021-09-29: 8 mg via INTRAVENOUS

## 2021-09-29 MED ORDER — PHENYLEPHRINE HCL-NACL 20-0.9 MG/250ML-% IV SOLN
INTRAVENOUS | Status: AC
  Filled 2021-09-29: qty 250

## 2021-09-29 MED ORDER — PROPOFOL 500 MG/50ML IV EMUL
INTRAVENOUS | Status: DC | PRN
  Administered 2021-09-29: 40 ug/kg/min via INTRAVENOUS

## 2021-09-29 MED ORDER — ACETAMINOPHEN 325 MG PO TABS
325.0000 mg | ORAL_TABLET | Freq: Four times a day (QID) | ORAL | Status: DC | PRN
  Administered 2021-09-30 – 2021-10-02 (×4): 650 mg via ORAL
  Filled 2021-09-29 (×4): qty 2

## 2021-09-29 MED ORDER — OXYCODONE HCL 5 MG/5ML PO SOLN: 5.0000 mg | Freq: Once | ORAL | Status: DC | PRN

## 2021-09-29 MED ORDER — BUPIVACAINE IN DEXTROSE 0.75-8.25 % IT SOLN
INTRATHECAL | Status: DC | PRN
  Administered 2021-09-29: 1.8 mL via INTRATHECAL

## 2021-09-29 MED ORDER — ONDANSETRON HCL 4 MG/2ML IJ SOLN
INTRAMUSCULAR | Status: DC | PRN
  Administered 2021-09-29: 4 mg via INTRAVENOUS

## 2021-09-29 MED ORDER — PHENOL 1.4 % MT LIQD: 1.0000 | OROMUCOSAL | Status: DC | PRN

## 2021-09-29 MED ORDER — OXYCODONE HCL 5 MG PO TABS
5.0000 mg | ORAL_TABLET | ORAL | Status: DC | PRN
  Administered 2021-09-30: 10 mg via ORAL
  Filled 2021-09-29 (×2): qty 2

## 2021-09-29 MED ORDER — ORAL CARE MOUTH RINSE: 15.0000 mL | Freq: Once | OROMUCOSAL | Status: DC

## 2021-09-29 MED ORDER — DOCUSATE SODIUM 100 MG PO CAPS
100.0000 mg | ORAL_CAPSULE | Freq: Two times a day (BID) | ORAL | Status: DC
  Administered 2021-09-29 – 2021-10-03 (×8): 100 mg via ORAL
  Filled 2021-09-29 (×8): qty 1

## 2021-09-29 MED ORDER — HYDROMORPHONE HCL 1 MG/ML IJ SOLN
0.5000 mg | INTRAMUSCULAR | Status: DC | PRN
  Administered 2021-09-29 (×3): 1 mg via INTRAVENOUS
  Administered 2021-09-30: 0.5 mg via INTRAVENOUS
  Administered 2021-09-30 – 2021-10-01 (×4): 1 mg via INTRAVENOUS
  Filled 2021-09-29 (×7): qty 1

## 2021-09-29 SURGICAL SUPPLY — 54 items
BAG COUNTER SPONGE SURGICOUNT (BAG) IMPLANT
BAG ZIPLOCK 12X15 (MISCELLANEOUS) ×2 IMPLANT
BENZOIN TINCTURE PRP APPL 2/3 (GAUZE/BANDAGES/DRESSINGS) IMPLANT
BLADE SAG 18X100X1.27 (BLADE) ×2 IMPLANT
BLADE SURG SZ10 CARB STEEL (BLADE) ×4 IMPLANT
BNDG ELASTIC 6X5.8 VLCR STR LF (GAUZE/BANDAGES/DRESSINGS) ×3 IMPLANT
BOWL SMART MIX CTS (DISPOSABLE) IMPLANT
COOLER ICEMAN CLASSIC (MISCELLANEOUS) ×2 IMPLANT
COVER SURGICAL LIGHT HANDLE (MISCELLANEOUS) ×2 IMPLANT
CUFF TOURN SGL QUICK 34 (TOURNIQUET CUFF) ×1
CUFF TRNQT CYL 34X4.125X (TOURNIQUET CUFF) ×1 IMPLANT
DRAPE INCISE IOBAN 66X45 STRL (DRAPES) ×2 IMPLANT
DRAPE U-SHAPE 47X51 STRL (DRAPES) ×2 IMPLANT
DRSG PAD ABDOMINAL 8X10 ST (GAUZE/BANDAGES/DRESSINGS) ×3 IMPLANT
DURAPREP 26ML APPLICATOR (WOUND CARE) ×2 IMPLANT
ELECT BLADE TIP CTD 4 INCH (ELECTRODE) ×2 IMPLANT
ELECT REM PT RETURN 15FT ADLT (MISCELLANEOUS) ×2 IMPLANT
FEMORAL POSTERIOR SZ4 RT (Femur) IMPLANT
GAUZE SPONGE 4X4 12PLY STRL (GAUZE/BANDAGES/DRESSINGS) ×2 IMPLANT
GAUZE XEROFORM 1X8 LF (GAUZE/BANDAGES/DRESSINGS) ×1 IMPLANT
GLOVE SRG 8 PF TXTR STRL LF DI (GLOVE) ×2 IMPLANT
GLOVE SURG ENC MOIS LTX SZ7.5 (GLOVE) ×2 IMPLANT
GLOVE SURG NEOPR MICRO LF SZ8 (GLOVE) ×2 IMPLANT
GLOVE SURG UNDER POLY LF SZ8 (GLOVE) ×2
GOWN STRL REUS W/ TWL XL LVL3 (GOWN DISPOSABLE) ×2 IMPLANT
GOWN STRL REUS W/TWL XL LVL3 (GOWN DISPOSABLE) ×2
HANDPIECE INTERPULSE COAX TIP (DISPOSABLE) ×1
HOLDER FOLEY CATH W/STRAP (MISCELLANEOUS) IMPLANT
IMMOBILIZER KNEE 20 (SOFTGOODS) ×2
IMMOBILIZER KNEE 20 THIGH 36 (SOFTGOODS) ×1 IMPLANT
INSERT TIBIAL TRIATHLON PS X3 (Insert) ×1 IMPLANT
KIT TURNOVER KIT A (KITS) IMPLANT
KNEE PATELLA ASYMMETRIC 10X32 (Knees) ×1 IMPLANT
KNEE TIBIAL COMP TRI SZ4 (Knees) ×1 IMPLANT
NS IRRIG 1000ML POUR BTL (IV SOLUTION) ×2 IMPLANT
PACK TOTAL KNEE CUSTOM (KITS) ×2 IMPLANT
PAD COLD SHLDR WRAP-ON (PAD) ×2 IMPLANT
PADDING CAST COTTON 6X4 STRL (CAST SUPPLIES) ×3 IMPLANT
PIN FLUTED HEDLESS FIX 3.5X1/8 (PIN) ×1 IMPLANT
POSTERIOR FEMORAL SZ4 RT (Femur) ×2 IMPLANT
PROTECTOR NERVE ULNAR (MISCELLANEOUS) ×2 IMPLANT
SET HNDPC FAN SPRY TIP SCT (DISPOSABLE) ×1 IMPLANT
SET PAD KNEE POSITIONER (MISCELLANEOUS) ×2 IMPLANT
SPIKE FLUID TRANSFER (MISCELLANEOUS) IMPLANT
STAPLER VISISTAT 35W (STAPLE) IMPLANT
STRIP CLOSURE SKIN 1/2X4 (GAUZE/BANDAGES/DRESSINGS) IMPLANT
SUT MNCRL AB 4-0 PS2 18 (SUTURE) IMPLANT
SUT VIC AB 0 CT1 27 (SUTURE) ×1
SUT VIC AB 0 CT1 27XBRD ANTBC (SUTURE) ×1 IMPLANT
SUT VIC AB 1 CT1 36 (SUTURE) ×4 IMPLANT
SUT VIC AB 2-0 CT1 27 (SUTURE) ×2
SUT VIC AB 2-0 CT1 TAPERPNT 27 (SUTURE) ×2 IMPLANT
TRAY FOLEY MTR SLVR 16FR STAT (SET/KITS/TRAYS/PACK) IMPLANT
WATER STERILE IRR 1000ML POUR (IV SOLUTION) ×4 IMPLANT

## 2021-09-29 NOTE — Plan of Care (Signed)
?  Problem: Pain Management: ?Goal: Pain level will decrease with appropriate interventions ?Outcome: Not Progressing ?  ?

## 2021-09-29 NOTE — Op Note (Signed)
NAME: Nathaniel Bailey, BUNN ?MEDICAL RECORD NO: [id number] ?ACCOUNT NO: [id number] ?DATE OF BIRTH: [birth year] ?FACILITY: [facility] ?LOCATION: [facility] ?PHYSICIAN: Vanita Panda. Magnus Ivan, MD ? ?Operative Report  ? ?DATE OF PROCEDURE: 09/29/2021 ? ?PREOPERATIVE DIAGNOSIS:  Primary osteoarthritis and degenerative joint disease, right knee. ? ?POSTOPERATIVE DIAGNOSIS:  Primary osteoarthritis and degenerative joint disease, right knee. ? ?PROCEDURE:  Right total knee arthroplasty. ? ?IMPLANTS:  Stryker Triathlon press-fit knee system with size 4 femur, size 4 tibial tray, 11 mm fixed bearing polyethylene insert, size 32 press-fit patellar button. ? ?SURGEON:  Vanita Panda. Magnus Ivan, MD ? ?ASSISTANT:  Richardean Canal, PA-C ? ?ANESTHESIA:   ?1.  Right lower extremity adductor canal block. ?2.  Spinal. ? ?TOURNIQUET TIME:  32 minutes. ? ?ANTIBIOTICS:  2 g IV Ancef. ? ?ESTIMATED BLOOD LOSS:  Less than 100 mL ? ?COMPLICATIONS:  None. ? ?INDICATIONS:  The patient is a 61 year old gentleman who has been seeing for many years now as relates to his right knee pain.  We have performed arthroscopic surgery of that knee and he has had hyaluronic acid and steroid injections over time.  He has  ?developed worsening arthritis.  At this point, his right knee pain is daily and he has failed conservative treatment.  It is detrimentally affecting his mobility, his quality of life and activities of daily living to the point he does wish to proceed  ?with the knee replacement and we have recommended this as well.  We did talk about the risk of acute blood loss anemia, nerve or vessel injury, fracture, infection, DVT, implant failure and skin and soft tissue issues.  We talked about our goals being  ?decreased pain, improve mobility and overall improve quality of life. ? ?DESCRIPTION OF PROCEDURE:  After informed consent was obtained, appropriate right knee was marked and adductor canal block was obtained in the right lower extremity in holding room.   He was then brought to the operating room and sat up on the operating  ?table where spinal anesthesia was obtained.  He was placed in supine position on the operating table where a Foley catheter was placed and a nonsterile tourniquet was placed around his upper right thigh.  His right thigh, knee, leg, ankle and foot were  ?prepped and draped with DuraPrep and sterile drapes including a sterile stockinette.  A timeout was called and he was identified as correct patient, correct right knee.  We then used Esmarch to wrap that leg and tourniquet was inflated to 300 mm of  ?pressure.  We then made a direct midline incision over the patella and carried this proximally and distally.  We dissected down the knee joint, carried out a medial parapatellar arthrotomy, finding, moderate joint effusion.  With the knee in a flexed  ?position, we removed osteophytes from all three compartments.  We found significant cartilage wear in the knee.  We removed remnants of ACL, PCL, medial and lateral meniscus.  We then proceeded with our proximal tibial cut, setting this with an  ?extramedullary guide for taking 9 mm off the high side, correcting for varus and valgus in neutral slope.  We made this cut without difficulty.  We then went to the femur and used the intramedullary guide for the femur for making our distal femoral cut  ?for a right knee at 5 degrees externally rotated for an 8 mm distal femoral cut.  We made this cut without difficulty, and brought the knee back down to full extension with a 9 mm extension block  and achieved full extension.  We then went back to the  ?femur and put our femoral sizing guide based off the epicondylar axis.  Based off of this, we chose a size 4 femur.  We put a 4-in-1 cutting block for a size 4 femur, made our anterior and posterior cuts followed by our chamfer cuts.  We then made our  ?femoral box cut.  This was all for press-fit implants. He had good bone quality.  We then went back to the  tibia and chose a size 4 tibial tray for coverage, setting the rotation off the tibial tubercle and the femur.  We then made our keel punch off of  ?this.  Again for press-fit.  With the size 4 trial tibia, we trialed a size 4 femur.  We went with a 9 and then 11 mm fixed bearing trial polyethylene insert. We were pleased with range of motion and stability with 9 mm insert, we then made our patellar  ?cut and drilled three holes for a size 32 press-fit patellar button.  We then removed all instrumentation from the knee and irrigated the knee with normal saline solution.  We dried the knee real well and then with the knee in a flexed position, placed  ?our real Stryker Triathlon press-fit tibial tray, size 4, followed by placing a real size 4 press-fit right femur.  We placed our real 11 mm fixed bearing polyethylene insert and press fit our size 32 patellar button.  We then put him through several  ?cycles of motion.  Again, we were pleased with range of motion and stability.  We then let the tourniquet down.  Hemostasis was obtained with electrocautery.  We closed the arthrotomy with interrupted #1 Vicryl suture followed by 0 Vicryl to close deep  ?tissue and 2-0 Vicryl to close the subcutaneous tissue.  The skin was closed with staples.  A well-padded sterile dressing was applied.  He was taken to recovery room in stable condition with all final counts being correct.  No complications noted.  Of  ?note, Richardean Canal, PA-C, assisted during the entire case from beginning to end and his assistance was crucial for retracting the soft tissues and soft tissue management as well as help him guide the implant placement and layered closure of the wound.   ?His assistance was medically necessary. ? ? ?PUS ?D: 09/29/2021 12:35:53 pm T: 09/29/2021 4:30:00 pm  ?JOB: [id number]/ [id number]  ?

## 2021-09-29 NOTE — Progress Notes (Signed)
Assisted Dr. Oddono with right, adductor canal, ultrasound guided block. Side rails up, monitors on throughout procedure. See vital signs in flow sheet. Tolerated Procedure well. 

## 2021-09-29 NOTE — Anesthesia Postprocedure Evaluation (Signed)
Anesthesia Post Note ? ?Patient: Nathaniel Bailey ? ?Procedure(s) Performed: Right TOTAL KNEE ARTHROPLASTY (Right: Knee) ? ?  ? ?Patient location during evaluation: PACU ?Anesthesia Type: Regional and Spinal ?Level of consciousness: awake and alert ?Pain management: pain level controlled ?Vital Signs Assessment: post-procedure vital signs reviewed and stable ?Respiratory status: spontaneous breathing, nonlabored ventilation, respiratory function stable and patient connected to nasal cannula oxygen ?Cardiovascular status: blood pressure returned to baseline and stable ?Postop Assessment: no apparent nausea or vomiting ?Anesthetic complications: no ? ? ?No notable events documented. ? ?Last Vitals:  ?Vitals:  ? 09/29/21 1330 09/29/21 1345  ?BP: 135/87 (!) 133/95  ?Pulse: (!) 59 62  ?Resp: 10 12  ?Temp:    ?SpO2: 100% 100%  ?  ?Last Pain:  ?Vitals:  ? 09/29/21 1330  ?TempSrc:   ?PainSc: 0-No pain  ? ? ?LLE Motor Response: Purposeful movement (09/29/21 1345) ?  ?RLE Motor Response: Purposeful movement (09/29/21 1345) ?  ?L Sensory Level: L3-Anterior knee, lower leg (09/29/21 1345) ?R Sensory Level: L3-Anterior knee, lower leg (09/29/21 1345) ? ?Iana Buzan ? ? ? ? ?

## 2021-09-29 NOTE — Anesthesia Procedure Notes (Signed)
Procedure Name: [place] ?Date/Time: 09/29/2021 11:25 AM ?Performed by: Victoriano Lain, CRNA ?Pre-anesthesia Checklist: Patient identified, Suction available, Emergency Drugs available, Patient being monitored and Timeout performed ?Patient Re-evaluated:Patient Re-evaluated prior to induction ?Oxygen Delivery Method: Simple face mask ?Dental Injury: Teeth and Oropharynx as per pre-operative assessment  ? ? ? ? ?

## 2021-09-29 NOTE — Anesthesia Procedure Notes (Signed)
Spinal ? ?Start time: 09/29/2021 11:27 AM ?End time: 09/29/2021 11:30 AM ?Reason for block: surgical anesthesia ?Staffing ?Resident/CRNA: Victoriano Lain, CRNA ?Preanesthetic Checklist ?Completed: patient identified, IV checked, site marked, risks and benefits discussed, surgical consent, monitors and equipment checked, pre-op evaluation and timeout performed ?Spinal Block ?Patient position: sitting ?Prep: DuraPrep and site prepped and draped ?Patient monitoring: cardiac monitor, continuous pulse ox and blood pressure ?Approach: midline ?Location: L3-4 ?Injection technique: single-shot ?Needle ?Needle type: Pencan  ?Needle gauge: 24 G ?Needle length: 10 cm ?Assessment ?Sensory level: T4 ?Events: CSF return ?Additional Notes ?Pt placed in sitting position for spinal placement. Spinal kit expiration date checked and verified. Sterile prep and drape of back. One attempt by CRNA. Clear CSF, - heme. Pt tolerated procedure well. ? ? ? ?

## 2021-09-29 NOTE — Transfer of Care (Signed)
Immediate Anesthesia Transfer of Care Note ? ?Patient: Nathaniel Bailey ? ?Procedure(s) Performed: Right TOTAL KNEE ARTHROPLASTY (Right: Knee) ? ?Patient Location: PACU ? ?Anesthesia Type:MAC and Spinal ? ?Level of Consciousness: awake, alert , oriented and patient cooperative ? ?Airway & Oxygen Therapy: Patient Spontanous Breathing and Patient connected to face mask oxygen ? ?Post-op Assessment: Report given to RN and Post -op Vital signs reviewed and stable ? ?Post vital signs: Reviewed and stable ? ?Last Vitals:  ?Vitals Value Taken Time  ?BP 118/82 09/29/21 1300  ?Temp    ?Pulse 65 09/29/21 1301  ?Resp 8 09/29/21 1301  ?SpO2 100 % 09/29/21 1301  ?Vitals shown include unvalidated device data. ? ?Last Pain:  ?Vitals:  ? 09/29/21 0852  ?TempSrc: Oral  ?   ? ?  ? ?Complications: No notable events documented. ?

## 2021-09-29 NOTE — Brief Op Note (Signed)
09/29/2021 ? ?12:37 PM ? ?PATIENT:  Nathaniel Bailey  61 y.o. male ? ?PRE-OPERATIVE DIAGNOSIS:  Osteoarthritis / Degenerative Joint Diease Right Knee ? ?POST-OPERATIVE DIAGNOSIS:  Osteoarthritis / Degenerative Joint Diease Right Knee ? ?PROCEDURE:  Procedure(s): ?Right TOTAL KNEE ARTHROPLASTY (Right) ? ?SURGEON:  Surgeon(s) and Role: ?   Kathryne Hitch, MD - Primary ? ?PHYSICIAN ASSISTANT:  Rexene Edison, PA-C ? ?ANESTHESIA:   regional and spinal ? ?EBL:  50 mL  ? ?COUNTS:  YES ? ?TOURNIQUET:   ?Total Tourniquet Time Documented: ?Thigh (Right) - 33 minutes ?Total: Thigh (Right) - 33 minutes ? ? ?DICTATION: .Other Dictation: Dictation Number [id number] ? ?PLAN OF CARE: Admit for overnight observation ? ?PATIENT DISPOSITION:  PACU - hemodynamically stable. ?  ?Delay start of Pharmacological VTE agent (>24hrs) due to surgical blood loss or risk of bleeding: no ? ?

## 2021-09-29 NOTE — Interval H&P Note (Signed)
History and Physical Interval Note: The patient understands that he is here today for a right knee replacement to treat his right knee osteoarthritis.  He has an interpreter with him who is explaining this as well.  I have seen him in the office plenty of times.  There has been no acute or interval change in his medical status.  Please see recent H&P.  The risks and benefits of surgery been explained in detail and informed consent was obtained. ? ?09/29/2021 ?10:19 AM ? ?Nathaniel Bailey Delson [place]  has presented today for surgery, with the diagnosis of Osteoarthritis / Degenerative Joint Diease Right Knee.  The various methods of treatment have been discussed with the patient and family. After consideration of risks, benefits and other options for treatment, the patient has consented to  Procedure(s): ?Right TOTAL KNEE ARTHROPLASTY (Right) as a surgical intervention.  The patient's history has been reviewed, patient examined, no change in status, stable for surgery.  I have reviewed the patient's chart and labs.  Questions were answered to the patient's satisfaction.   ? ? ?Kathryne Hitch ? ? ?

## 2021-09-29 NOTE — Anesthesia Procedure Notes (Addendum)
?  Anesthesia Regional Block: Adductor canal block  ? ?Pre-Anesthetic Checklist: , timeout performed,  Correct Patient, Correct Site, Correct Laterality,  Correct Procedure, Correct Position, site marked,  Risks and benefits discussed,  Surgical consent,  Pre-op evaluation,  At surgeon's request and post-op pain management ? ?Laterality: Right ? ?Prep: chloraprep     ?  ?Needles:  ?Injection technique: Single-shot ? ?Needle Type: Echogenic Stimulator Needle   ? ? ?Needle Length: 5cm  ?Needle Gauge: 22  ? ? ? ?Additional Needles: ? ? ?Procedures:, nerve stimulator,,, ultrasound used (permanent image in chart),,    ?Narrative:  ?Start time: 09/29/2021 10:30 AM ?End time: 09/29/2021 10:35 AM ?Injection made incrementally with aspirations every 5 mL. ? ?Performed by: Personally  ?Anesthesiologist: Bethena Midget, MD ? ?Additional Notes: ?Functioning IV was confirmed and monitors were applied.  A 48mm 22ga Arrow echogenic stimulator needle was used. Sterile prep and drape,hand hygiene and sterile gloves were used. Ultrasound guidance: relevant anatomy identified, needle position confirmed, local anesthetic spread visualized around nerve(s)., vascular puncture avoided.  Image printed for medical record. Negative aspiration and negative test dose prior to incremental administration of local anesthetic. The patient tolerated the procedure well. ? ? ? ? ?

## 2021-09-29 NOTE — Progress Notes (Signed)
PT Cancellation Note ? ?Patient Details ?Name: Nathaniel Bailey ?MRN: [id number] ?DOB: August 14, 1960 ? ? ?Cancelled Treatment:    Reason Eval/Treat Not Completed: Pain limiting ability to participate. Pt reporting 9/10 pain not responding to pain medications. We will follow up as schedule and staffing allows.  ? ?Jamesetta Geralds, PT, DPT ?[facility] ?Office: [phone] ?Pager: [phone] ?Jamesetta Geralds ?09/29/2021, 5:45 PM ?

## 2021-09-29 NOTE — Anesthesia Preprocedure Evaluation (Signed)
Anesthesia Evaluation  ?Patient identified by MRN, date of birth, ID band ?Patient awake ? ? ? ?Reviewed: ?Allergy & Precautions, NPO status , Patient's Chart, lab work & pertinent test results ? ?Airway ?Mallampati: II ? ?TM Distance: >3 FB ?Neck ROM: Full ? ? ? Dental ? ?(+) Teeth Intact, Caps, Poor Dentition,  ?  ?Pulmonary ?neg pulmonary ROS,  ?  ?Pulmonary exam normal ?breath sounds clear to auscultation ? ? ? ? ? ? Cardiovascular ?hypertension, Pt. on medications ?Normal cardiovascular exam ?Rhythm:Regular Rate:Normal ? ? ?  ?Neuro/Psych ? Headaches, PSYCHIATRIC DISORDERS Depression   ? GI/Hepatic ?negative GI ROS, Neg liver ROS,   ?Endo/Other  ?negative endocrine ROS ? Renal/GU ?negative Renal ROS  ?negative genitourinary ?  ?Musculoskeletal ? ?(+) Arthritis , Osteoarthritis,  Torn medial meniscus right knee  ? Abdominal ?  ?Peds ? Hematology ?negative hematology ROS ?(+)   ?Anesthesia Other Findings ? ? Reproductive/Obstetrics ? ?  ? ? ? ? ? ? ? ? ? ? ? ? ? ?  ?  ? ? ? ? ? ? ? ? ?Anesthesia Physical ? ?Anesthesia Plan ? ?ASA: 2 ? ?Anesthesia Plan: MAC, Regional and Spinal  ? ?Post-op Pain Management: Regional block* and Minimal or no pain anticipated  ? ?Induction: Intravenous ? ?PONV Risk Score and Plan: Midazolam, Treatment may vary due to age or medical condition and Propofol infusion ? ?Airway Management Planned: LMA and Oral ETT ? ?Additional Equipment: None ? ?Intra-op Plan:  ? ?Post-operative Plan:  ? ?Informed Consent: I have reviewed the patients History and Physical, chart, labs and discussed the procedure including the risks, benefits and alternatives for the proposed anesthesia with the patient or authorized representative who has indicated his/her understanding and acceptance.  ? ? ? ?Dental advisory given ? ?Plan Discussed with: Anesthesiologist, CRNA and Surgeon ? ?Anesthesia Plan Comments:   ? ? ? ? ? ? ?Anesthesia Quick Evaluation ? ?

## 2021-09-30 LAB — BASIC METABOLIC PANEL
Anion gap: 9 (ref 5–15)
BUN: 21 mg/dL — ABNORMAL HIGH (ref 6–20)
CO2: 25 mmol/L (ref 22–32)
Calcium: 8.6 mg/dL — ABNORMAL LOW (ref 8.9–10.3)
Chloride: 101 mmol/L (ref 98–111)
Creatinine, Ser: 0.89 mg/dL (ref 0.61–1.24)
GFR, Estimated: >60 mL/min (ref >60)
Glucose, Bld: 138 mg/dL — ABNORMAL HIGH (ref 70–99)
Potassium: 4.2 mmol/L (ref 3.5–5.1)
Sodium: 135 mmol/L (ref 135–145)

## 2021-09-30 LAB — CBC
HCT: 42.8 % (ref 39.0–52.0)
Hemoglobin: 14.6 g/dL (ref 13.0–17.0)
MCH: 28.9 pg (ref 26.0–34.0)
MCHC: 34.1 g/dL (ref 30.0–36.0)
MCV: 84.8 fL (ref 80.0–100.0)
Platelets: 204 10*3/uL (ref 150–400)
RBC: 5.05 MIL/uL (ref 4.22–5.81)
RDW: 13 % (ref 11.5–15.5)
WBC: 16 10*3/uL — ABNORMAL HIGH (ref 4.0–10.5)
nRBC: 0 % (ref 0.0–0.2)

## 2021-09-30 NOTE — Discharge Instructions (Signed)

## 2021-09-30 NOTE — Plan of Care (Signed)
  Problem: Pain Management: Goal: Pain level will decrease with appropriate interventions Outcome: Progressing   Problem: Coping: Goal: Level of anxiety will decrease Outcome: Progressing   

## 2021-09-30 NOTE — Plan of Care (Signed)
?  Problem: Activity: ?Goal: Range of joint motion will improve ?Outcome: Progressing ?  ?Problem: Activity: ?Goal: Ability to avoid complications of mobility impairment will improve ?Outcome: Progressing ?  ?Problem: Pain Management: ?Goal: Pain level will decrease with appropriate interventions ?Outcome: Progressing ?  ?

## 2021-09-30 NOTE — Evaluation (Signed)
Physical Therapy Evaluation ?Patient Details ?Name: Nathaniel Bailey ?MRN: [id number] ?DOB: [birth year] ?Today's Date: 09/30/2021 ? ?History of Present Illness ? 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  ?Clinical Impression ? On eval, pt was Min guard assist for mobility. He walked ~50 feet with a RW. Ambulation distance limited by nausea and dizziness. Moderate pain during session. Will plan to progress activity as tolerated. Discussed d/c plan-pt plans to d/c back to his apt where he lives alone. He reported he has some friends that will check in on him (extent of help he will have at home). Requested OT consult for ADL education.    ?   ? ?Recommendations for follow up therapy are one component of a multi-disciplinary discharge planning process, led by the attending physician.  Recommendations may be updated based on patient status, additional functional criteria and insurance authorization. ? ?Follow Up Recommendations Follow physician's recommendations for discharge plan and follow up therapies ? ?  ?Assistance Recommended at Discharge Intermittent Supervision/Assistance  ?Patient can return home with the following ? A little help with walking and/or transfers;A little help with bathing/dressing/bathroom;Assistance with cooking/housework;Assist for transportation;Help with stairs or ramp for entrance ? ?  ?Equipment Recommendations Rolling walker (2 wheels)  ?Recommendations for Other Services ?    ?  ?Functional Status Assessment Patient has had a recent decline in their functional status and demonstrates the ability to make significant improvements in function in a reasonable and predictable amount of time.  ? ?  ?Precautions / Restrictions Precautions ?Precautions: Fall;Knee ?Restrictions ?Weight Bearing Restrictions: No ?RLE Weight Bearing: Weight bearing as tolerated  ? ?  ? ?Mobility ? Bed Mobility ?Overal bed mobility: Needs Assistance ?Bed Mobility: Supine to Sit ?  ?  ?Supine to sit: Min guard, HOB  elevated ?  ?  ?General bed mobility comments: Min guard for safety. Pt c/o some dizziness-eventually able to proceed with transfers ?  ? ?Transfers ?Overall transfer level: Needs assistance ?Equipment used: Rolling walker (2 wheels) ?Transfers: Sit to/from Stand ?Sit to Stand: Min guard ?  ?  ?  ?  ?  ?General transfer comment: Cues for safety, technique, hand placement. Min guard for safety. ?  ? ?Ambulation/Gait ?Ambulation/Gait assistance: Min guard ?Gait Distance (Feet): 50 Feet ?Assistive device: Rolling walker (2 wheels) ?Gait Pattern/deviations: Step-to pattern, Decreased stance time - right ?  ?  ?  ?General Gait Details: Cues for safety, sequence, technique, proper use of RW, and for pt to put weight thru R LE. Followed with recliner. Distance limited by nausea, dizziness. ? ?Stairs ?  ?  ?  ?  ?  ? ?Wheelchair Mobility ?  ? ?Modified Rankin (Stroke Patients Only) ?  ? ?  ? ?Balance Overall balance assessment: Needs assistance ?  ?  ?  ?  ?Standing balance support: Bilateral upper extremity supported, During functional activity, Reliant on assistive device for balance ?Standing balance-Leahy Scale: Poor ?  ?  ?  ?  ?  ?  ?  ?  ?  ?  ?  ?  ?   ? ? ? ?Pertinent Vitals/Pain Pain Assessment ?Pain Assessment: Faces ?Faces Pain Scale: Hurts even more ?Pain Location: R knee ?Pain Descriptors / Indicators: Aching, Discomfort, Sore ?Pain Intervention(s): Limited activity within patient's tolerance, Monitored during session  ? ? ?Home Living Family/patient expects to be discharged to:: Private residence ?Living Arrangements: Alone ?  ?Type of Home: Apartment ?Home Access: Level entry ?  ?  ?  ?Home Layout: One level ?  Home Equipment: None ?   ?  ?Prior Function Prior Level of Function : Independent/Modified Independent ?  ?  ?  ?  ?  ?  ?  ?  ?  ? ? ?Hand Dominance  ?   ? ?  ?Extremity/Trunk Assessment  ? Upper Extremity Assessment ?Upper Extremity Assessment: Overall WFL for tasks assessed ?  ? ?Lower Extremity  Assessment ?Lower Extremity Assessment: Generalized weakness ?  ? ?Cervical / Trunk Assessment ?Cervical / Trunk Assessment: Normal  ?Communication  ? Communication: Prefers language other than English (Spanish Speaking)  ?Cognition Arousal/Alertness: Awake/alert ?Behavior During Therapy: [facility] for tasks assessed/performed ?Overall Cognitive Status: Within Functional Limits for tasks assessed ?  ?  ?  ?  ?  ?  ?  ?  ?  ?  ?  ?  ?  ?  ?  ?  ?  ?  ?  ? ?  ?General Comments   ? ?  ?Exercises    ? ?Assessment/Plan  ?  ?PT Assessment Patient needs continued PT services  ?PT Problem List Decreased strength;Decreased mobility;Decreased range of motion;Decreased activity tolerance;Decreased balance;Decreased knowledge of use of DME;Pain ? ?   ?  ?PT Treatment Interventions DME instruction;Therapeutic exercise;Gait training;Balance training;Stair training;Functional mobility training;Therapeutic activities;Patient/family education   ? ?PT Goals (Current goals can be found in the Care Plan section)  ?Acute Rehab PT Goals ?Patient Stated Goal: none stated ?PT Goal Formulation: With patient ?Time For Goal Achievement: 10/14/21 ?Potential to Achieve Goals: Good ? ?  ?Frequency 7X/week ?  ? ? ?Co-evaluation   ?  ?  ?  ?  ? ? ?  ?AM-PAC PT "6 Clicks" Mobility  ?Outcome Measure Help needed turning from your back to your side while in a flat bed without using bedrails?: A Little ?Help needed moving from lying on your back to sitting on the side of a flat bed without using bedrails?: A Little ?Help needed moving to and from a bed to a chair (including a wheelchair)?: A Little ?Help needed standing up from a chair using your arms (e.g., wheelchair or bedside chair)?: A Little ?Help needed to walk in hospital room?: A Little ?Help needed climbing 3-5 steps with a railing? : A Little ?6 Click Score: 18 ? ?  ?End of Session Equipment Utilized During Treatment: Gait belt ?Activity Tolerance: Patient tolerated treatment well (but limited by  nausea and dizziness) ?Patient left: in chair;with call bell/phone within reach;with chair alarm set ?  ?PT Visit Diagnosis: Pain;Other abnormalities of gait and mobility (R26.89) ?Pain - Right/Left: Right ?Pain - part of body: Knee ?  ? ?Time: 9024-0973 ?PT Time Calculation (min) (ACUTE ONLY): 28 min ? ? ?Charges:   PT Evaluation ?$PT Eval Low Complexity: 1 Low ?PT Treatments ?$Gait Training: 8-22 mins ?  ?   ? ? ? ? ? ?Faye Ramsay, PT ?Acute Rehabilitation  ?Office: [phone] ?Pager: [phone] ? ?  ? ?

## 2021-09-30 NOTE — Progress Notes (Signed)
Physical Therapy Treatment ?Patient Details ?Name: Nathaniel Bailey ?MRN: [id number] ?DOB: Mar 02, 1961 ?Today's Date: 09/30/2021 ? ? 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 ? ?  ?PT Comments  ? ? Progressing with mobility. LOB x 1 while in bathroom standing at sink preparing to brush teeth. Cues for safety and reminder not to forget walker while in bathroom. Moderate pain with activity. Possible plan for d/c home on tomorrow. Not sure if pt will have much help at home.  ?   ?Recommendations for follow up therapy are one component of a multi-disciplinary discharge planning process, led by the attending physician.  Recommendations may be updated based on patient status, additional functional criteria and insurance authorization. ? ?Follow Up Recommendations ? Follow physician's recommendations for discharge plan and follow up therapies ?  ?  ?Assistance Recommended at Discharge Intermittent Supervision/Assistance  ?Patient can return home with the following A little help with walking and/or transfers;A little help with bathing/dressing/bathroom;Assistance with cooking/housework;Assist for transportation;Help with stairs or ramp for entrance ?  ?Equipment Recommendations ? Rolling walker (2 wheels)  ?  ?Recommendations for Other Services   ? ? ?  ?Precautions / Restrictions Precautions ?Precautions: Fall;Knee ?Restrictions ?Weight Bearing Restrictions: No ?RLE Weight Bearing: Weight bearing as tolerated  ?  ? ?Mobility ? Bed Mobility ?Overal bed mobility: Needs Assistance ?Bed Mobility: Supine to Sit, Sit to Supine ?  ?  ?Supine to sit: Min guard, HOB elevated ?Sit to supine: Min assist, HOB elevated ?  ?General bed mobility comments: Assist for R LE onto bed. ?  ? ?Transfers ?Overall transfer level: Needs assistance ?Equipment used: Rolling walker (2 wheels) ?Transfers: Sit to/from Stand ?Sit to Stand: Min guard ?  ?  ?  ?  ?  ?General transfer comment: Cues for safety, technique, hand  placement. Min guard for safety. ?  ? ?Ambulation/Gait ?Ambulation/Gait assistance: Min guard ?Gait Distance (Feet): 75 Feet ?Assistive device: Rolling walker (2 wheels) ?Gait Pattern/deviations: Step-to pattern ?  ?  ?  ?General Gait Details: Min guard for safety. Slow but steady gait. Pt did report some mild dizziness ? ? ?Stairs ?  ?  ?  ?  ?  ? ? ?Wheelchair Mobility ?  ? ?Modified Rankin (Stroke Patients Only) ?  ? ? ?  ?Balance Overall balance assessment: Needs assistance ?  ?  ?  ?  ?Standing balance support: Bilateral upper extremity supported, During functional activity, Reliant on assistive device for balance ?Standing balance-Leahy Scale: Poor ?  ?  ?  ?  ?  ?  ?  ?  ?  ?  ?  ?  ?  ? ?  ?Cognition Arousal/Alertness: Awake/alert ?Behavior During Therapy: [facility] for tasks assessed/performed ?Overall Cognitive Status: Within Functional Limits for tasks assessed ?  ?  ?  ?  ?  ?  ?  ?  ?  ?  ?  ?  ?  ?  ?  ?  ?  ?  ?  ? ?  ?Exercises Total Joint Exercises ?Ankle Circles/Pumps: AROM, Both, 10 reps ?Quad Sets: AROM, Right, 10 reps ?Heel Slides: AAROM, Right, 10 reps ?Hip ABduction/ADduction: AAROM, Right, AROM, 10 reps ?Straight Leg Raises: AAROM, Right, 10 reps ?Goniometric ROM: ~10-55 degrees ? ?  ?General Comments   ?  ?  ? ?Pertinent Vitals/Pain Pain Assessment ?Pain Assessment: 0-10 ?Faces Pain Scale: Hurts whole lot ?Pain Location: R knee ?Pain Descriptors / Indicators: Aching, Discomfort, Sore, Operative site guarding ?Pain Intervention(s): Limited  activity within patient's tolerance, Monitored during session, Ice applied, Repositioned  ? ? ?Home Living   ?  ?  ?  ?  ?  ?  ?  ?  ?  ?   ?  ?Prior Function    ?  ?  ?   ? ?PT Goals (current goals can now be found in the care plan section) Progress towards PT goals: Progressing toward goals ? ?  ?Frequency ? ? ? 7X/week ? ? ? ?  ?PT Plan Current plan remains appropriate  ? ? ?Co-evaluation   ?  ?  ?  ?  ? ?  ?AM-PAC PT "6 Clicks" Mobility   ?Outcome Measure ?  Help needed turning from your back to your side while in a flat bed without using bedrails?: A Little ?Help needed moving from lying on your back to sitting on the side of a flat bed without using bedrails?: A Little ?Help needed moving to and from a bed to a chair (including a wheelchair)?: A Little ?Help needed standing up from a chair using your arms (e.g., wheelchair or bedside chair)?: A Little ?Help needed to walk in hospital room?: A Little ?Help needed climbing 3-5 steps with a railing? : A Little ?6 Click Score: 18 ? ?  ?End of Session Equipment Utilized During Treatment: Gait belt ?Activity Tolerance: Patient tolerated treatment well ?Patient left: in bed;with call bell/phone within reach;with bed alarm set ?  ?PT Visit Diagnosis: Pain;Other abnormalities of gait and mobility (R26.89) ?Pain - Right/Left: Right ?Pain - part of body: Knee ?  ? ? ?Time: 4680-3212 ?PT Time Calculation (min) (ACUTE ONLY): 32 min ? ?Charges:  $Gait Training: 8-22 mins ?$Therapeutic Exercise: 8-22 mins          ?          ? ? ? ? ? ?Faye Ramsay, PT ?Acute Rehabilitation  ?Office: [phone] ?Pager: [phone] ? ?  ? ?

## 2021-09-30 NOTE — Progress Notes (Signed)
Subjective: ?1 Day Post-Op Procedure(s) (LRB): ?Right TOTAL KNEE ARTHROPLASTY (Right) ?Patient reports pain as 7 on 0-10 scale.  Nausea this morning. Has not been up with PT yet.  ? ?Objective: ?Vital signs in last 24 hours: ?Temp:  [97.5 ?F (36.4 ?C)-98.4 ?F (36.9 ?C)] 98.4 ?F (36.9 ?C) (03/25 0800) ?Pulse Rate:  [59-103] 71 (03/25 0800) ?Resp:  [10-18] 18 (03/25 0800) ?BP: (112-157)/(67-99) 117/67 (03/25 0800) ?SpO2:  [94 %-100 %] 97 % (03/25 0428) ?Weight:  [69.4 kg] 69.4 kg (03/24 1443) ? ?Intake/Output from previous day: ?03/24 0701 - 03/25 0700 ?In: 2973.1 [I.V.:2673.1; IV Piggyback:300] ?Out: 2800 [Urine:2750; Blood:50] ?Intake/Output this shift: ?Total I/O ?In: 240 [P.O.:240] ?Out: -  ? ?Recent Labs  ?  09/30/21 ?0314  ?HGB 14.6  ? ?Recent Labs  ?  09/30/21 ?0314  ?WBC 16.0*  ?RBC 5.05  ?HCT 42.8  ?PLT 204  ? ?Recent Labs  ?  09/30/21 ?0314  ?NA 135  ?K 4.2  ?CL 101  ?CO2 25  ?BUN 21*  ?CREATININE 0.89  ?GLUCOSE 138*  ?CALCIUM 8.6*  ? ?No results for input(s): LABPT, INR in the last 72 hours. ? ?Sensation intact distally ?Intact pulses distally ?Dorsiflexion/Plantar flexion intact ?Incision: no drainage ?Compartment soft ? ? ?Assessment/Plan: ?1 Day Post-Op Procedure(s) (LRB): ?Right TOTAL KNEE ARTHROPLASTY (Right) ?Up with therapy weight bearing as tolerated  ?Dressing changed. HgB stable.  ? ? ? ? ? ?Lew Prout ?09/30/2021, 8:44 AM ? ?

## 2021-10-01 DIAGNOSIS — Z96651 Presence of right artificial knee joint: Secondary | ICD-10-CM

## 2021-10-01 MED ORDER — KETOROLAC TROMETHAMINE 15 MG/ML IJ SOLN
15.0000 mg | Freq: Four times a day (QID) | INTRAMUSCULAR | Status: AC
Start: 2021-10-01 — End: 2021-10-02
  Administered 2021-10-01 – 2021-10-02 (×4): 15 mg via INTRAVENOUS
  Filled 2021-10-01 (×4): qty 1

## 2021-10-01 NOTE — Progress Notes (Signed)
Patient ID: Nathaniel Bailey, male   DOB: 04/01/1961, 61 y.o.   MRN: [id number] ?He is definitely making some progress with his mobility.  However, he does live alone and does not have much support.  I do feel that he is going to be able to get to home but he is having difficulty with pain control and today is likely not a good day to send him home.  His vital signs are stable.  I will order IV Toradol for today with the goal of hopefully discharging him to home tomorrow when he is more safe from mobility standpoint. ?

## 2021-10-01 NOTE — Progress Notes (Signed)
Physical Therapy Treatment ?Patient Details ?Name: Nathaniel Bailey ?MRN: [id number] ?DOB: Nov 22, 1960 ?Today's Date: 10/01/2021 ? ? 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 ? ?  ?PT Comments  ? ? Pt agreeable to working with PT this afternoon. He reports pain is better controlled. He walked to the bathroom. While in the bathroom, pt became nauseous and vomited x 2-made RN aware. He also c/o some dizziness. Deferred hallway ambulation and assisted pt back to bed.  Pt performed a few ROM exercises in bed. Will continue to follow and progress activity as tolerated.  ?  ?Recommendations for follow up therapy are one component of a multi-disciplinary discharge planning process, led by the attending physician.  Recommendations may be updated based on patient status, additional functional criteria and insurance authorization. ? ?Follow Up Recommendations ? Follow physician's recommendations for discharge plan and follow up therapies ?  ?  ?Assistance Recommended at Discharge Intermittent Supervision/Assistance  ?Patient can return home with the following A little help with walking and/or transfers;A little help with bathing/dressing/bathroom;Assistance with cooking/housework;Assist for transportation;Help with stairs or ramp for entrance ?  ?Equipment Recommendations ? Rolling walker (2 wheels)  ?  ?Recommendations for Other Services   ? ? ?  ?Precautions / Restrictions Precautions ?Precautions: Fall;Knee ?Restrictions ?Weight Bearing Restrictions: No ?RLE Weight Bearing: Weight bearing as tolerated  ?  ? ?Mobility ? Bed Mobility ?Overal bed mobility: Needs Assistance ?Bed Mobility: Supine to Sit, Sit to Supine ?  ?  ?Supine to sit: Min guard, HOB elevated ?Sit to supine: Min guard, HOB elevated ?  ?General bed mobility comments: Min guard for safety. Increased time. ?  ? ?Transfers ?Overall transfer level: Needs assistance ?Equipment used: Rolling walker (2 wheels) ?Transfers: Sit to/from  Stand ?Sit to Stand: Min guard ?  ?  ?  ?  ?  ?General transfer comment: Cues for safety, technique, hand placement. Min guard for safety. ?  ? ?Ambulation/Gait ?Ambulation/Gait assistance: Min guard ?Gait Distance (Feet): 15 Feet (x2) ?Assistive device: Rolling walker (2 wheels) ?Gait Pattern/deviations: Step-to pattern ?  ?  ?  ?General Gait Details: Min guard for safety. Slow but steady gait. Pt did report some dizziness. While standing at the sink in the bathroom, pt vomited x 2. Deferred hallway ambulation and assisted pt back to bed ? ? ?Stairs ?  ?  ?  ?  ?  ? ? ?Wheelchair Mobility ?  ? ?Modified Rankin (Stroke Patients Only) ?  ? ? ?  ?Balance   ?  ?  ?  ?  ?  ?  ?  ?  ?  ?  ?  ?  ?  ?  ?  ?  ?  ?  ?  ? ?  ?Cognition Arousal/Alertness: Awake/alert ?Behavior During Therapy: [facility] for tasks assessed/performed ?Overall Cognitive Status: Within Functional Limits for tasks assessed ?  ?  ?  ?  ?  ?  ?  ?  ?  ?  ?  ?  ?  ?  ?  ?  ?  ?  ?  ? ?  ?Exercises Total Joint Exercises ?Ankle Circles/Pumps: AROM, Both, 10 reps ?Heel Slides: AAROM, Right, 10 reps (pt used gait belt to assist) ?Hip ABduction/ADduction: AAROM, Right, 10 reps (pt used gait belt to assist) ?Straight Leg Raises: AAROM, Right, 10 reps (pt used gait belt to assist) ?Goniometric ROM: ~10-45 degres (limited by pain) ? ?  ?General Comments   ?  ?  ? ?  Pertinent Vitals/Pain Pain Assessment ?Pain Assessment: Faces ?Faces Pain Scale: Hurts even more ?Pain Location: R knee ?Pain Descriptors / Indicators: Aching, Discomfort, Sore, Operative site guarding ?Pain Intervention(s): Limited activity within patient's tolerance, Monitored during session, Ice applied  ? ? ?Home Living   ?  ?  ?  ?  ?  ?  ?  ?  ?  ?   ?  ?Prior Function    ?  ?  ?   ? ?PT Goals (current goals can now be found in the care plan section) Progress towards PT goals: Progressing toward goals ? ?  ?Frequency ? ? ? 7X/week ? ? ? ?  ?PT Plan Current plan remains appropriate  ? ? ?Co-evaluation    ?  ?  ?  ?  ? ?  ?AM-PAC PT "6 Clicks" Mobility   ?Outcome Measure ? Help needed turning from your back to your side while in a flat bed without using bedrails?: A Little ?Help needed moving from lying on your back to sitting on the side of a flat bed without using bedrails?: A Little ?Help needed moving to and from a bed to a chair (including a wheelchair)?: A Little ?Help needed standing up from a chair using your arms (e.g., wheelchair or bedside chair)?: A Little ?Help needed to walk in hospital room?: A Little ?Help needed climbing 3-5 steps with a railing? : A Little ?6 Click Score: 18 ? ?  ?End of Session Equipment Utilized During Treatment: Gait belt ?Activity Tolerance: Patient tolerated treatment well (but limited by nausea) ?Patient left: in bed;with call bell/phone within reach;with bed alarm set ?  ?PT Visit Diagnosis: Pain;Other abnormalities of gait and mobility (R26.89) ?Pain - Right/Left: Right ?Pain - part of body: Knee ?  ? ? ?Time: 9735-3299 ?PT Time Calculation (min) (ACUTE ONLY): 30 min ? ?Charges:  $Gait Training: 8-22 mins ?$Therapeutic Exercise: 8-22 mins          ?          ? ? ? ? ? ?Faye Ramsay, PT ?Acute Rehabilitation  ?Office: [phone] ?Pager: [phone] ? ?  ? ?

## 2021-10-01 NOTE — Progress Notes (Signed)
OT Cancellation Note ? ?Patient Details ?Name: Nathaniel Bailey ?MRN: [id number] ?DOB: 1961/06/24 ? ? ?Cancelled Treatment:    Reason Eval/Treat Not Completed: Pain limiting ability to participate ? ?[name] ?10/01/2021, 1:00 PM ?

## 2021-10-01 NOTE — Plan of Care (Signed)
?  Problem: Activity: ?Goal: Ability to avoid complications of mobility impairment will improve ?Outcome: Progressing ?  ?Problem: Clinical Measurements: ?Goal: Postoperative complications will be avoided or minimized ?Outcome: Progressing ?  ?Problem: Pain Management: ?Goal: Pain level will decrease with appropriate interventions ?Outcome: Progressing ?  ?Problem: Clinical Measurements: ?Goal: Cardiovascular complication will be avoided ?Outcome: Progressing ?  ?Problem: Clinical Measurements: ?Goal: Respiratory complications will improve ?Outcome: Progressing ?  ?

## 2021-10-02 MED ORDER — TIZANIDINE HCL 4 MG PO TABS: 4.0000 mg | ORAL_TABLET | Freq: Four times a day (QID) | ORAL | 0 refills | Status: DC | PRN

## 2021-10-02 MED ORDER — ASPIRIN 81 MG PO CHEW: 81.0000 mg | CHEWABLE_TABLET | Freq: Two times a day (BID) | ORAL | 0 refills | Status: AC

## 2021-10-02 MED ORDER — OXYCODONE HCL 5 MG PO TABS: 5.0000 mg | ORAL_TABLET | Freq: Four times a day (QID) | ORAL | 0 refills | Status: DC | PRN

## 2021-10-02 NOTE — Discharge Summary (Signed)
?Patient ID: ?Nathaniel Bailey Nathaniel Bailey ?MRN: [id number] ?DOB/AGE: 1961/03/21 61 y.o. ? ?Admit date: 09/29/2021 ?Discharge date: 10/02/2021 ? ?Admission Diagnoses:  ?Principal Problem: ?  Unilateral primary osteoarthritis, right knee ?Active Problems: ?  Status post total right knee replacement ?  S/P revision of total knee, right ?  Status post total knee replacement, right ? ? ?Discharge Diagnoses:  ?Same ? ?Past Medical History:  ?Diagnosis Date  ? Arthritis   ? Headache   ? Hypertension   ? NO MEDS , patient reports sometimes elevated   ? Pre-diabetes   ? Pt denies. A1c was 7.8 06/13/21.  ? ? ?Surgeries: Procedure(s): ?Right TOTAL KNEE ARTHROPLASTY on 09/29/2021 ?  ?Consultants:  ? ?Discharged Condition: Improved ? ?Hospital Course: Nathaniel Bailey Nathaniel Bailey is an 61 y.o. male who was admitted 09/29/2021 for operative treatment ofUnilateral primary osteoarthritis, right knee. Patient has severe unremitting pain that affects sleep, daily activities, and work/hobbies. After pre-op clearance the patient was taken to the operating room on 09/29/2021 and underwent  Procedure(s): ?Right TOTAL KNEE ARTHROPLASTY.   ? ?Patient was given perioperative antibiotics:  ?Anti-infectives (From admission, onward)  ? ? Start     Dose/Rate Route Frequency Ordered Stop  ? 09/29/21 1730  ceFAZolin (ANCEF) IVPB 1 g/50 mL premix       ? 1 g ?100 mL/hr over 30 Minutes Intravenous Every 6 hours 09/29/21 1439 09/30/21 0017  ? 09/29/21 0900  ceFAZolin (ANCEF) IVPB 2g/100 mL premix       ? 2 g ?200 mL/hr over 30 Minutes Intravenous On call to O.R. 09/29/21 0848 09/29/21 1201  ? ?  ?  ? ?Patient was given sequential compression devices, early ambulation, and chemoprophylaxis to prevent DVT. ? ?Patient benefited maximally from hospital stay and there were no complications.   ? ?Recent vital signs: Patient Vitals for the past 24 hrs: ? BP Temp Temp src Pulse Resp SpO2  ?10/02/21 0549 140/60 99.8 ?F (37.7 ?C) -- 96 18 99 %  ?10/01/21 2056 132/76 --  -- 95 -- 97 %  ?10/01/21 2055 -- 100 ?F (37.8 ?C) Oral 95 16 99 %  ?10/01/21 1607 140/81 99.6 ?F (37.6 ?C) Oral 91 16 98 %  ?  ? ?Recent laboratory studies:  ?Recent Labs  ?  09/30/21 ?0314  ?WBC 16.0*  ?HGB 14.6  ?HCT 42.8  ?PLT 204  ?NA 135  ?K 4.2  ?CL 101  ?CO2 25  ?BUN 21*  ?CREATININE 0.89  ?GLUCOSE 138*  ?CALCIUM 8.6*  ? ? ? ?Discharge Medications:   ?Allergies as of 10/02/2021   ?No Known Allergies ?  ? ?  ?Medication List  ?  ? ?TAKE these medications   ? ?amLODipine 5 MG tablet ?Commonly known as: NORVASC ?Take 5 mg by mouth daily. ?  ?aspirin 81 MG chewable tablet ?Chew 1 tablet (81 mg total) by mouth 2 (two) times daily. ?  ?metFORMIN 500 MG tablet ?Commonly known as: GLUCOPHAGE ?Take 500 mg by mouth 2 (two) times daily. ?  ?oxyCODONE 5 MG immediate release tablet ?Commonly known as: Oxy IR/ROXICODONE ?Take 1-2 tablets (5-10 mg total) by mouth every 6 (six) hours as needed for moderate pain (pain score 4-6). ?  ?pantoprazole 40 MG tablet ?Commonly known as: PROTONIX ?Take by mouth. ?  ?tiZANidine 4 MG tablet ?Commonly known as: Zanaflex ?Take 1 tablet (4 mg total) by mouth every 6 (six) hours as needed for muscle spasms. ?  ? ?  ? ?  ?  ? ? ?  ?  Durable Medical Equipment  ?(From admission, onward)  ?  ? ? ?  ? ?  Start     Ordered  ? 09/29/21 1440  DME 3 n 1  Once       ? 09/29/21 1439  ? 09/29/21 1440  DME Walker rolling  Once       ?Question Answer Comment  ?Walker: With 5 Inch Wheels   ?Patient needs a walker to treat with the following condition Status post total right knee replacement   ?  ? 09/29/21 1439  ? ?  ?  ? ?  ? ? ?Diagnostic Studies: DG Knee Right Port ? ?Result Date: 09/29/2021 ?CLINICAL DATA:  A 61 year old male presents for evaluation of knee replacement. EXAM: PORTABLE RIGHT KNEE - 1-2 VIEW COMPARISON:  Preoperative evaluation from August 10, 2020. FINDINGS: Immediate postprocedural changes from RIGHT total knee arthroplasty are demonstrated. Femoral and tibial components appear well  position without signs of immediate postprocedural complication. Skin staples over the midline anterior knee. No visible fracture or expected radiographic findings. IMPRESSION: Immediate postoperative appearance of RIGHT total knee arthroplasty without unexpected findings. Electronically Signed   By: Donzetta Kohut M.D.   On: 09/29/2021 13:27   ? ?Disposition: Discharge disposition: 01-Home or Self Care ? ? ? ? ? ? ?Discharge Instructions   ? ? Discharge patient   Complete by: As directed ?  ? Discharge disposition: 01-Home or Self Care  ? Discharge patient date: 10/02/2021  ? ?  ? ? ? Follow-up Information   ? ? Kathryne Hitch, MD Follow up in 2 week(s).   ?Specialty: Orthopedic Surgery ?Contact information: ?[address] ?[place] [address] ?[phone] ? ? ?  ?  ? ?  ?  ? ?  ? ? ? ?Signed: ?Kathryne Hitch ?10/02/2021, 7:27 AM ? ?  ?

## 2021-10-02 NOTE — Evaluation (Signed)
Occupational Therapy Evaluation ?Patient Details ?Name: Nathaniel Bailey ?MRN: [id number] ?DOB: Jan 05, 1961 ?Today's Date: 10/02/2021 ? ? 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  ? ?Clinical Impression ?  ?Patient is a 61 year old male who was admitted for above. Currently, patients increased pain and nausea is impacting ability to engage in ADL tasks. Patient is currently unable to lift RLE with impact on education for LB dressing. Patient plans to transition home alone with occasional support from friend.patient inquired multiple times when pain medication would make pain stop during session with patient educated on pain medication goal being to make pain tolerable not to completely eliminate. Patients questions were passed on to the nurse. Translator monica [phone] was used for this session.  Patient would need 24/7 caregiver support to be successful in next level of care at this time.  Patient would continue to benefit from skilled OT services at this time while admitted and after d/c to address noted deficits in order to improve overall safety and independence in ADLs.  ? ?   ? ?Recommendations for follow up therapy are one component of a multi-disciplinary discharge planning process, led by the attending physician.  Recommendations may be updated based on patient status, additional functional criteria and insurance authorization.  ? ?Follow Up Recommendations ? Follow physician's recommendations for discharge plan and follow up therapies  ?  ?Assistance Recommended at Discharge Frequent or constant Supervision/Assistance  ?Patient can return home with the following A lot of help with walking and/or transfers;A lot of help with bathing/dressing/bathroom;Assistance with cooking/housework;Direct supervision/assist for medications management;Assist for transportation;Help with stairs or ramp for entrance;Direct supervision/assist for financial management ? ?  ?Functional Status Assessment ?  Patient has had a recent decline in their functional status and demonstrates the ability to make significant improvements in function in a reasonable and predictable amount of time.  ?Equipment Recommendations ? Other (comment) (total hip kit)  ?  ?Recommendations for Other Services   ? ? ?  ?Precautions / Restrictions Precautions ?Precautions: Fall;Knee ?Restrictions ?Weight Bearing Restrictions: No ?RLE Weight Bearing: Weight bearing as tolerated  ? ?  ? ?Mobility Bed Mobility ?Overal bed mobility: Needs Assistance ?Bed Mobility: Supine to Sit, Sit to Supine ?  ?  ?Supine to sit: Min assist, HOB elevated ?  ?  ?General bed mobility comments: with education to use leg lifter with increased time. ?  ? ?Transfers ?  ?  ?  ?  ?  ?  ?  ?  ?  ?  ?  ? ?  ?Balance   ?  ?  ?  ?  ?  ?  ?  ?  ?  ?  ?  ?  ?  ?  ?  ?  ?  ?  ?   ? ?ADL either performed or assessed with clinical judgement  ? ?ADL Overall ADL's : Needs assistance/impaired ?Eating/Feeding: Set up;Sitting ?  ?Grooming: Dance movement psychotherapist;Wash/dry hands;Sitting;Set up ?  ?Upper Body Bathing: Set up;Sitting ?Upper Body Bathing Details (indicate cue type and reason): sitting on edge of bed ?Lower Body Bathing: Moderate assistance;Sit to/from stand;Sitting/lateral leans ?  ?Upper Body Dressing : Minimal assistance;Sitting ?Upper Body Dressing Details (indicate cue type and reason): EOB ?Lower Body Dressing: Moderate assistance ?Lower Body Dressing Details (indicate cue type and reason): patient was educated on how to use AD for LB dressing tasks.patient was mod A for LBd ressing with inability to pick up RLE off the floor. patient needed  physical assist to lift RLE for LB dressing education. patients session was limited with increased pain in RLE with nausea and gurggling noted in trunk sitting EOB. patient repeatdly asked through translator if pain medication would make him feel no pain. patient was educated that goal of pain medication was to make pain less but not to  completely eliminate pain. patient continued to ask about when pain would go away ect. patient was redirected towards strategies to help keep pain mangeable with ice ect. patient's nurse was made aware of patients questions during session pain level and nausea during session. ?  ?Armed forces technical officer Details (indicate cue type and reason): unable to attempt with patients nausea ?Toileting- Clothing Manipulation and Hygiene: Maximal assistance;Sitting/lateral lean;Sit to/from stand ?  ?  ?  ?  ?   ? ? ? ?Vision Patient Visual Report: No change from baseline ?   ?   ?Perception   ?  ?Praxis   ?  ? ?Pertinent Vitals/Pain Pain Assessment ?Pain Assessment: Faces ?Faces Pain Scale: Hurts even more ?Pain Location: R knee/thigh ?Pain Descriptors / Indicators: Aching, Discomfort, Sore, Operative site guarding ?Pain Intervention(s): Limited activity within patient's tolerance, Monitored during session, Ice applied  ? ? ? ?Hand Dominance Right ?  ?Extremity/Trunk Assessment Upper Extremity Assessment ?Upper Extremity Assessment: Overall WFL for tasks assessed ?  ?Lower Extremity Assessment ?Lower Extremity Assessment: Defer to PT evaluation ?  ?Cervical / Trunk Assessment ?Cervical / Trunk Assessment: Normal ?  ?Communication Communication ?Communication: Prefers language other than English (spanish) ?  ?Cognition Arousal/Alertness: Awake/alert ?Behavior During Therapy: [facility] for tasks assessed/performed ?Overall Cognitive Status: Within Functional Limits for tasks assessed ?  ?  ?  ?  ?  ?  ?  ?  ?  ?  ?  ?  ?  ?  ?  ?  ?  ?  ?  ?General Comments    ? ?  ?Exercises   ?  ?Shoulder Instructions    ? ? ?Home Living Family/patient expects to be discharged to:: Private residence ?Living Arrangements: Alone ?  ?Type of Home: Apartment ?Home Access: Level entry ?  ?  ?Home Layout: One level ?  ?  ?  ?  ?  ?  ?  ?Home Equipment: None ?  ?  ?  ? ?  ?Prior Functioning/Environment Prior Level of Function : Independent/Modified Independent ?  ?   ?  ?  ?  ?  ?  ?  ?  ? ?  ?  ?OT Problem List: Decreased activity tolerance;Pain;Impaired balance (sitting and/or standing);Decreased safety awareness;Decreased strength;Decreased knowledge of precautions;Decreased knowledge of use of DME or AE ?  ?   ?OT Treatment/Interventions: Self-care/ADL training;Therapeutic exercise;Neuromuscular education;Energy conservation;Therapeutic activities;DME and/or AE instruction;Patient/family education  ?  ?OT Goals(Current goals can be found in the care plan section) Acute Rehab OT Goals ?Patient Stated Goal: to get pain under control ?OT Goal Formulation: With patient ?Time For Goal Achievement: 10/16/21 ?Potential to Achieve Goals: Good  ?OT Frequency: Min 2X/week ?  ? ?Co-evaluation   ?  ?  ?  ?  ? ?  ?AM-PAC OT "6 Clicks" Daily Activity     ?Outcome Measure Help from another person eating meals?: A Little ?Help from another person taking care of personal grooming?: A Little ?Help from another person toileting, which includes using toliet, bedpan, or urinal?: A Lot ?Help from another person bathing (including washing, rinsing, drying)?: A Lot ?Help from another person to put on and taking off regular  upper body clothing?: A Little ?Help from another person to put on and taking off regular lower body clothing?: A Lot ?6 Click Score: 15 ?  ?End of Session Equipment Utilized During Treatment: Gait belt;Rolling walker (2 wheels) ?Nurse Communication: Other (comment) (patients pain and nausea during session) ? ?Activity Tolerance: Patient tolerated treatment well ?Patient left: in bed;with call bell/phone within reach;Other (comment) (with PT) ? ?OT Visit Diagnosis: Unsteadiness on feet (R26.81);Muscle weakness (generalized) (M62.81);Pain  ?              ?Time: 6195-0932 ?OT Time Calculation (min): 42 min ?Charges:  OT General Charges ?$OT Visit: 1 Visit ?OT Evaluation ?$OT Eval Moderate Complexity: 1 Mod ?OT Treatments ?$Self Care/Home Management : 23-37 mins ? ?Molly Fash  OTR/L, MS ?Acute Rehabilitation Department ?Office# [phone] ?Pager# [phone] ? ? ?Feliz Beam Fash ?10/02/2021, 1:33 PM ?

## 2021-10-02 NOTE — Progress Notes (Signed)
Physical Therapy Treatment ?Patient Details ?Name: Nathaniel Bailey ?MRN: [id number] ?DOB: 1960/09/16 ?Today's Date: 10/02/2021 ? ? 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 ? ?  ?PT Comments  ? ? Better overall this afternoon compared to this morning. Pt continues to report having some nausea and lightheadedness. Will continue to progress activity.    ?Recommendations for follow up therapy are one component of a multi-disciplinary discharge planning process, led by the attending physician.  Recommendations may be updated based on patient status, additional functional criteria and insurance authorization. ? ?Follow Up Recommendations ? Follow physician's recommendations for discharge plan and follow up therapies ?  ?  ?Assistance Recommended at Discharge Intermittent Supervision/Assistance  ?Patient can return home with the following A little help with walking and/or transfers;A little help with bathing/dressing/bathroom;Assistance with cooking/housework;Assist for transportation;Help with stairs or ramp for entrance ?  ?Equipment Recommendations ?    ?  ?Recommendations for Other Services   ? ? ?  ?Precautions / Restrictions Precautions ?Precautions: Fall;Knee ?Restrictions ?Weight Bearing Restrictions: No ?RLE Weight Bearing: Weight bearing as tolerated  ?  ? ?Mobility ? Bed Mobility ?Overal bed mobility: Needs Assistance ?Bed Mobility: Supine to Sit, Sit to Supine ?  ?  ?Supine to sit: Min guard, HOB elevated ?Sit to supine: Min guard, HOB elevated ?  ?General bed mobility comments: with education to use leg lifter with increased time. ?  ? ?Transfers ?Overall transfer level: Needs assistance ?Equipment used: Rolling walker (2 wheels) ?Transfers: Sit to/from Stand ?Sit to Stand: Min guard ?  ?  ?  ?  ?  ?General transfer comment: Cues for safety, technique, hand placement. Min guard for safety. Increased time. Pt c/o mild lightheadedness and nausea ?  ? ?Ambulation/Gait ?Ambulation/Gait  assistance: Min guard ?Gait Distance (Feet): 75 Feet ?Assistive device: Rolling walker (2 wheels) ?Gait Pattern/deviations: Step-to pattern, Antalgic ?  ?  ?  ?General Gait Details: Tolerated increased ambulation distance well this afternoon. Still has some mild lightheadedness and nausea ? ? ?Stairs ?  ?  ?  ?  ?  ? ? ?Wheelchair Mobility ?  ? ?Modified Rankin (Stroke Patients Only) ?  ? ? ?  ?Balance Overall balance assessment: Needs assistance ?  ?  ?  ?  ?Standing balance support: Bilateral upper extremity supported, During functional activity, Reliant on assistive device for balance ?Standing balance-Leahy Scale: Poor ?  ?  ?  ?  ?  ?  ?  ?  ?  ?  ?  ?  ?  ? ?  ?Cognition Arousal/Alertness: Awake/alert ?Behavior During Therapy: [facility] for tasks assessed/performed ?Overall Cognitive Status: Within Functional Limits for tasks assessed ?  ?  ?  ?  ?  ?  ?  ?  ?  ?  ?  ?  ?  ?  ?  ?  ?  ?  ?  ? ?  ?Exercises Total Joint Exercises ?Heel Slides: AAROM, Right, 5 reps, Supine (with pt using gait belt to assist (limited by pain)) ?Hip ABduction/ADduction: AAROM, Right, 10 reps ?Straight Leg Raises: AAROM, Right, 10 reps ?Knee Flexion: AAROM, Right, 10 reps, Seated ?Goniometric ROM: ~10-45 degrees (limited by pain) ? ?  ?General Comments   ?  ?  ? ?Pertinent Vitals/Pain Pain Assessment ?Pain Assessment: Faces ?Pain Score: 8  ?Faces Pain Scale: Hurts even more ?Pain Location: R knee/thigh ?Pain Descriptors / Indicators: Aching, Discomfort, Sore, Operative site guarding ?Pain Intervention(s): Limited activity within patient's tolerance, Monitored during  session, Ice applied, Repositioned  ? ? ?Home Living Family/patient expects to be discharged to:: Private residence ?Living Arrangements: Alone ?  ?Type of Home: Apartment ?Home Access: Level entry ?  ?  ?  ?Home Layout: One level ?Home Equipment: None ?   ?  ?Prior Function    ?  ?  ?   ? ?PT Goals (current goals can now be found in the care plan section) Progress towards PT  goals: Progressing toward goals ? ?  ?Frequency ? ? ? 7X/week ? ? ? ?  ?PT Plan Current plan remains appropriate  ? ? ?Co-evaluation   ?  ?  ?  ?  ? ?  ?AM-PAC PT "6 Clicks" Mobility   ?Outcome Measure ? Help needed turning from your back to your side while in a flat bed without using bedrails?: A Little ?Help needed moving from lying on your back to sitting on the side of a flat bed without using bedrails?: A Little ?Help needed moving to and from a bed to a chair (including a wheelchair)?: A Little ?Help needed standing up from a chair using your arms (e.g., wheelchair or bedside chair)?: A Little ?Help needed to walk in hospital room?: A Little ?Help needed climbing 3-5 steps with a railing? : A Lot ?6 Click Score: 17 ? ?  ?End of Session Equipment Utilized During Treatment: Gait belt ?Activity Tolerance: Patient tolerated treatment well ?Patient left: in bed;with call bell/phone within reach;with bed alarm set ?  ?PT Visit Diagnosis: Pain;Other abnormalities of gait and mobility (R26.89) ?Pain - Right/Left: Right ?Pain - part of body: Knee ?  ? ? ?Time: 2330-0762 ?PT Time Calculation (min) (ACUTE ONLY): 35 min ? ?Charges:  $Gait Training: 8-22 mins ?$Therapeutic Exercise: 8-22 mins          ?          ? ? ? ? ?Faye Ramsay, PT ?Acute Rehabilitation  ?Office: [phone] ?Pager: [phone] ? ?  ? ?

## 2021-10-02 NOTE — Progress Notes (Signed)
Patient ID: Nathaniel Bailey, male   DOB: Nov 08, 1960, 61 y.o.   MRN: [id number] ?No acute changes.  Right knee is stable.  Vitals stable.  Can be discharged to home today. ?

## 2021-10-02 NOTE — Plan of Care (Signed)
  Problem: Coping: Goal: Level of anxiety will decrease Outcome: Progressing   Problem: Pain Managment: Goal: General experience of comfort will improve Outcome: Progressing   

## 2021-10-02 NOTE — Progress Notes (Signed)
Physical Therapy Treatment ?Patient Details ?Name: Nathaniel Bailey ?MRN: [id number] ?DOB: [birth year] ?Today's Date: 10/02/2021 ? ? 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 ? ?  ?PT Comments  ? ? Pt agreeable to working with PT however he c/o nausea. After walking ~10 feet, pt vomited. Afterwards, he was able to stand at this sink to brush his teeth. He then walked another 25 feet. He continued to report nausea and dizziness. Pt was able to work on seated knee flexion a bit before returning to bed. Will continue to follow and progress activity as tolerated.  ?   ?Recommendations for follow up therapy are one component of a multi-disciplinary discharge planning process, led by the attending physician.  Recommendations may be updated based on patient status, additional functional criteria and insurance authorization. ? ?Follow Up Recommendations ? Follow physician's recommendations for discharge plan and follow up therapies ?  ?  ?Assistance Recommended at Discharge Intermittent Supervision/Assistance  ?Patient can return home with the following A little help with walking and/or transfers;A little help with bathing/dressing/bathroom;Assistance with cooking/housework;Assist for transportation;Help with stairs or ramp for entrance ?  ?Equipment Recommendations ? Rolling walker (2 wheels)  ?  ?Recommendations for Other Services   ? ? ?  ?Precautions / Restrictions Precautions ?Precautions: Fall;Knee ?Restrictions ?Weight Bearing Restrictions: No ?RLE Weight Bearing: Weight bearing as tolerated  ?  ? ?Mobility ? Bed Mobility ?Overal bed mobility: Needs Assistance ?Bed Mobility: Supine to Sit, Sit to Supine ?  ?  ?Supine to sit: Min guard, HOB elevated ?Sit to supine: HOB elevated ?  ?General bed mobility comments: Min guard for safety. Increased time. ?  ? ?Transfers ?Overall transfer level: Needs assistance ?Equipment used: Rolling walker (2 wheels) ?Transfers: Sit to/from Stand ?Sit to Stand: Min  guard ?  ?  ?  ?  ?  ?General transfer comment: Cues for safety, technique, hand placement. Min guard for safety. Increased time. Pt c/o dizziness "mucho" ?  ? ?Ambulation/Gait ?Ambulation/Gait assistance: Min guard ?Gait Distance (Feet): 35 Feet ?Assistive device: Rolling walker (2 wheels) ?Gait Pattern/deviations: Step-to pattern, Antalgic ?  ?  ?  ?General Gait Details: Ambulation distance limited again today due to nausea/vomiting. Pt also continues to report dizziness. ? ? ?Stairs ?  ?  ?  ?  ?  ? ? ?Wheelchair Mobility ?  ? ?Modified Rankin (Stroke Patients Only) ?  ? ? ?  ?Balance   ?  ?  ?  ?  ?  ?  ?  ?  ?  ?  ?  ?  ?  ?  ?  ?  ?  ?  ?  ? ?  ?Cognition Arousal/Alertness: Awake/alert ?Behavior During Therapy: [facility] for tasks assessed/performed ?Overall Cognitive Status: Within Functional Limits for tasks assessed ?  ?  ?  ?  ?  ?  ?  ?  ?  ?  ?  ?  ?  ?  ?  ?  ?  ?  ?  ? ?  ?Exercises Total Joint Exercises ?Knee Flexion: AAROM, Right, 10 reps, Seated ?Goniometric ROM: ~10-45 degrees (limited by pain) ? ?  ?General Comments   ?  ?  ? ?Pertinent Vitals/Pain Pain Assessment ?Pain Assessment: 0-10 ?Pain Score: 8  ?Pain Location: R knee/thigh ?Pain Descriptors / Indicators: Aching, Discomfort, Sore, Operative site guarding ?Pain Intervention(s): Limited activity within patient's tolerance, Monitored during session, Ice applied, Repositioned  ? ? ?Home Living   ?  ?  ?  ?  ?  ?  ?  ?  ?  ?   ?  ?  Prior Function    ?  ?  ?   ? ?PT Goals (current goals can now be found in the care plan section) Progress towards PT goals: Progressing toward goals ? ?  ?Frequency ? ? ? 7X/week ? ? ? ?  ?PT Plan Current plan remains appropriate  ? ? ?Co-evaluation   ?  ?  ?  ?  ? ?  ?AM-PAC PT "6 Clicks" Mobility   ?Outcome Measure ? Help needed turning from your back to your side while in a flat bed without using bedrails?: A Little ?Help needed moving from lying on your back to sitting on the side of a flat bed without using bedrails?:  A Little ?Help needed moving to and from a bed to a chair (including a wheelchair)?: A Little ?Help needed standing up from a chair using your arms (e.g., wheelchair or bedside chair)?: A Little ?Help needed to walk in hospital room?: A Little ?Help needed climbing 3-5 steps with a railing? : A Lot ?6 Click Score: 17 ? ?  ?End of Session Equipment Utilized During Treatment: Gait belt ?Activity Tolerance: Patient limited by pain;Patient limited by fatigue (limited by nausea/vomiting) ?Patient left: in bed;with call bell/phone within reach;with bed alarm set ?  ?PT Visit Diagnosis: Pain;Other abnormalities of gait and mobility (R26.89) ?Pain - Right/Left: Right ?Pain - part of body: Knee ?  ? ? ?Time: 3875-6433 ?PT Time Calculation (min) (ACUTE ONLY): 37 min ? ?Charges:  $Gait Training: 23-37 mins          ?          ? ? ? ? ? ?Faye Ramsay, PT ?Acute Rehabilitation  ?Office: [phone] ?Pager: [phone] ? ?  ? ?

## 2021-10-03 ENCOUNTER — Encounter (HOSPITAL_COMMUNITY): Payer: Self-pay | Admitting: Orthopaedic Surgery

## 2021-10-03 MED ORDER — ONDANSETRON 4 MG PO TBDP: 4.0000 mg | ORAL_TABLET | Freq: Three times a day (TID) | ORAL | 0 refills | Status: AC | PRN

## 2021-10-03 NOTE — Progress Notes (Signed)
Discharge done with spanish AVS partial and interpreter pt states he understands instructions and is going ot call his friend for a ride home D Susann Givens RN ?

## 2021-10-03 NOTE — Plan of Care (Signed)
?  Problem: Education: ?Goal: Knowledge of the prescribed therapeutic regimen will improve ?Outcome: Adequate for Discharge ?  ?Problem: Activity: ?Goal: Ability to avoid complications of mobility impairment will improve ?Outcome: Adequate for Discharge ?Goal: Range of joint motion will improve ?Outcome: Adequate for Discharge ?  ?Problem: Clinical Measurements: ?Goal: Postoperative complications will be avoided or minimized ?Outcome: Adequate for Discharge ?  ?Problem: Pain Management: ?Goal: Pain level will decrease with appropriate interventions ?Outcome: Adequate for Discharge ?  ?Problem: Skin Integrity: ?Goal: Will show signs of wound healing ?Outcome: Adequate for Discharge ?  ?Problem: Health Behavior/Discharge Planning: ?Goal: Ability to manage health-related needs will improve ?Outcome: Adequate for Discharge ?  ?Problem: Clinical Measurements: ?Goal: Ability to maintain clinical measurements within normal limits will improve ?Outcome: Adequate for Discharge ?Goal: Will remain free from infection ?Outcome: Adequate for Discharge ?Goal: Diagnostic test results will improve ?Outcome: Adequate for Discharge ?Goal: Respiratory complications will improve ?Outcome: Adequate for Discharge ?Goal: Cardiovascular complication will be avoided ?Outcome: Adequate for Discharge ?  ?Problem: Activity: ?Goal: Risk for activity intolerance will decrease ?Outcome: Adequate for Discharge ?  ?Problem: Coping: ?Goal: Level of anxiety will decrease ?Outcome: Adequate for Discharge ?  ?Problem: Pain Managment: ?Goal: General experience of comfort will improve ?Outcome: Adequate for Discharge ?  ?Problem: Safety: ?Goal: Ability to remain free from injury will improve ?Outcome: Adequate for Discharge ?  ?Problem: Acute Rehab OT Goals (only OT should resolve) ?Goal: Pt. Will Perform Lower Body Dressing ?Outcome: Adequate for Discharge ?Goal: Pt. Will Transfer To Toilet ?Outcome: Adequate for Discharge ?Goal: Pt. Will Perform  Toileting-Clothing Manipulation ?Outcome: Adequate for Discharge ?  ?

## 2021-10-03 NOTE — Progress Notes (Signed)
Physical Therapy Treatment ?Patient Details ?Name: Nathaniel Bailey ?MRN: [id number] ?DOB: [birth year] ?Today's Date: 10/03/2021 ? ? 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 ? ?  ?PT Comments  ? ? Progressing slowly with mobility. Supv-Min guard this session. Tried working on knee flexion a bit but he can be deterred by the pain easily. He is still having some nausea. He has concerns about being able to manage at home. Utilized interpreter (tele) to try to help answer his questions-would recommend other team members do the same so that he fully understands. Would benefit from a TOC consult to help answer his questions as well. Recommend HHPT f/u if that is at all a possibility.  ?   ?Recommendations for follow up therapy are one component of a multi-disciplinary discharge planning process, led by the attending physician.  Recommendations may be updated based on patient status, additional functional criteria and insurance authorization. ? ?Follow Up Recommendations ? Follow physician's recommendations for discharge plan and follow up therapies (could benefit from HHPT if possible) ?  ?  ?Assistance Recommended at Discharge Intermittent Supervision/Assistance  ?Patient can return home with the following A little help with walking and/or transfers;A little help with bathing/dressing/bathroom;Assistance with cooking/housework;Assist for transportation;Help with stairs or ramp for entrance ?  ?Equipment Recommendations ? Rolling walker (2 wheels)  ?  ?Recommendations for Other Services   ? ? ?  ?Precautions / Restrictions Precautions ?Precautions: Fall;Knee ?Restrictions ?Weight Bearing Restrictions: No ?RLE Weight Bearing: Weight bearing as tolerated  ?  ? ?Mobility ? Bed Mobility ?  ?  ?  ?  ?  ?  ?  ?General bed mobility comments: oob in recliner ?  ? ?Transfers ?Overall transfer level: Needs assistance ?Equipment used: Rolling walker (2 wheels) ?Transfers: Sit to/from Stand ?Sit to Stand:  Supervision ?  ?  ?  ?  ?  ?General transfer comment: Cues for safety, technique, hand placement. Increased time. Pt c/o mild nausea ?  ? ?Ambulation/Gait ?Ambulation/Gait assistance: Min guard ?Gait Distance (Feet): 75 Feet ?Assistive device: Rolling walker (2 wheels) ?Gait Pattern/deviations: Step-to pattern, Antalgic ?  ?  ?  ?General Gait Details: Tolerated distance well. Min guard for safety. Some nausea still ? ? ?Stairs ?  ?  ?  ?  ?  ? ? ?Wheelchair Mobility ?  ? ?Modified Rankin (Stroke Patients Only) ?  ? ? ?  ?Balance Overall balance assessment: Needs assistance ?  ?  ?  ?  ?Standing balance support: Bilateral upper extremity supported, During functional activity, Reliant on assistive device for balance ?Standing balance-Leahy Scale: Poor ?  ?  ?  ?  ?  ?  ?  ?  ?  ?  ?  ?  ?  ? ?  ?Cognition Arousal/Alertness: Awake/alert ?Behavior During Therapy: [facility] for tasks assessed/performed ?Overall Cognitive Status: Within Functional Limits for tasks assessed ?  ?  ?  ?  ?  ?  ?  ?  ?  ?  ?  ?  ?  ?  ?  ?  ?  ?  ?  ? ?  ?Exercises Total Joint Exercises ?Ankle Circles/Pumps: AROM, Both, 10 reps ?Knee Flexion: AAROM, Right, 10 reps, Seated ?Goniometric ROM: ~10-45 degrees (limited by pain) ? ?  ?General Comments   ?  ?  ? ?Pertinent Vitals/Pain Pain Assessment ?Pain Assessment: Faces ?Faces Pain Scale: Hurts even more ?Pain Location: R knee/thigh ?Pain Descriptors / Indicators: Aching, Discomfort, Sore, Operative site guarding ?Pain  Intervention(s): Limited activity within patient's tolerance, Monitored during session, Ice applied, Repositioned  ? ? ?Home Living   ?  ?  ?  ?  ?  ?  ?  ?  ?  ?   ?  ?Prior Function    ?  ?  ?   ? ?PT Goals (current goals can now be found in the care plan section) Progress towards PT goals: Progressing toward goals ? ?  ?Frequency ? ? ? 7X/week ? ? ? ?  ?PT Plan Current plan remains appropriate  ? ? ?Co-evaluation   ?  ?  ?  ?  ? ?  ?AM-PAC PT "6 Clicks" Mobility   ?Outcome Measure ?  Help needed turning from your back to your side while in a flat bed without using bedrails?: A Little ?Help needed moving from lying on your back to sitting on the side of a flat bed without using bedrails?: A Little ?Help needed moving to and from a bed to a chair (including a wheelchair)?: A Little ?Help needed standing up from a chair using your arms (e.g., wheelchair or bedside chair)?: A Little ?Help needed to walk in hospital room?: A Little ?Help needed climbing 3-5 steps with a railing? : A Lot ?6 Click Score: 17 ? ?  ?End of Session Equipment Utilized During Treatment: Gait belt ?Activity Tolerance: Patient tolerated treatment well (still having some nausea) ?Patient left: in chair;with call bell/phone within reach ?  ?PT Visit Diagnosis: Pain;Other abnormalities of gait and mobility (R26.89) ?Pain - Right/Left: Right ?Pain - part of body: Knee ?  ? ? ?Time: 2563-8937 ?PT Time Calculation (min) (ACUTE ONLY): 43 min ? ?Charges:  $Gait Training: 23-37 mins ?$Therapeutic Exercise: 8-22 mins          ?          ? ? ? ? ?Faye Ramsay, PT ?Acute Rehabilitation  ?Office: [phone] ?Pager: [phone] ? ?  ? ?

## 2021-10-03 NOTE — Progress Notes (Signed)
Physical Therapy Treatment ?Patient Details ?Name: Nathaniel Bailey ?MRN: [id number] ?DOB: July 21, 1960 ?Today's Date: 10/03/2021 ? ? 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 ? ?  ?PT Comments  ? ? Utilized tele interpreter to explain session and that plan was for discharge home later if he does well. He continues to have some mild nausea and lightheadedness-no vomiting today and BP WNL-see vitals section. Reviewed/practiced gait training and exercises. Pt has a handout in room (in Spanish) that he has been following along with. All education completed. HHPT will be beneficial for pt and will help him transition back to his home environment.  ?   ?Recommendations for follow up therapy are one component of a multi-disciplinary discharge planning process, led by the attending physician.  Recommendations may be updated based on patient status, additional functional criteria and insurance authorization. ? ?Follow Up Recommendations ? Follow physician's recommendations for discharge plan and follow up therapies ?  ?  ?Assistance Recommended at Discharge Intermittent Supervision/Assistance  ?Patient can return home with the following A little help with walking and/or transfers;A little help with bathing/dressing/bathroom;Assistance with cooking/housework;Assist for transportation;Help with stairs or ramp for entrance ?  ?Equipment Recommendations ? Rolling walker (2 wheels)  ?  ?Recommendations for Other Services   ? ? ?  ?Precautions / Restrictions Precautions ?Precautions: Fall;Knee ?Restrictions ?Weight Bearing Restrictions: No ?RLE Weight Bearing: Weight bearing as tolerated  ?  ? ?Mobility ? Bed Mobility ?Overal bed mobility: Needs Assistance ?Bed Mobility: Sit to Supine ?  ?  ?Supine to sit: Supervision, HOB elevated ?Sit to supine: Supervision ?  ?General bed mobility comments: Pt used gait belt but he is likely able to get LE on/off bed unassisted. ?  ? ?Transfers ?Overall transfer level:  Needs assistance ?Equipment used: Rolling walker (2 wheels) ?Transfers: Sit to/from Stand ?Sit to Stand: Supervision ?  ?  ?  ?  ?  ?General transfer comment: Cues for safety, technique, hand placement. Increased time. Pt c/o mild nausea, lightheadedness-able to proceed with ambulation ?  ? ?Ambulation/Gait ?Ambulation/Gait assistance: Supervision ?Gait Distance (Feet): 80 Feet ?Assistive device: Rolling walker (2 wheels) ?Gait Pattern/deviations: Decreased stride length, Decreased step length - right ?  ?  ?  ?General Gait Details: Supv for safety. Pt reported some mild nausea and lightheadedness but no difficulty tolerating distance. ? ? ?Stairs ?  ?  ?  ?  ?  ? ? ?Wheelchair Mobility ?  ? ?Modified Rankin (Stroke Patients Only) ?  ? ? ?  ?Balance Overall balance assessment: Needs assistance ?  ?  ?  ?  ?Standing balance support: No upper extremity supported, During functional activity, Reliant on assistive device for balance ?Standing balance-Leahy Scale: Fair ?  ?  ?  ?  ?  ?  ?  ?  ?  ?  ?  ?  ?  ? ?  ?Cognition Arousal/Alertness: Awake/alert ?Behavior During Therapy: [facility] for tasks assessed/performed ?Overall Cognitive Status: Within Functional Limits for tasks assessed ?  ?  ?  ?  ?  ?  ?  ?  ?  ?  ?  ?  ?  ?  ?  ?  ?  ?  ?  ? ?  ?Exercises Total Joint Exercises ?Ankle Circles/Pumps: AROM, Both, 10 reps ?Quad Sets: AROM, Both, 10 reps ?Heel Slides: AAROM, Right, 10 reps (used belt to assist) ?Hip ABduction/ADduction: AROM, AAROM, Right, 10 reps (performed 10 with belt then 5 without belt) ?Straight Leg Raises:  AROM, AAROM, Right, 10 reps (performed 10 with belt, then 5 without belt) ?Knee Flexion: AAROM, Right, 10 reps, Seated ?Goniometric ROM: ~5-60 degrees (still limited by pain but better this afternoon) ? ?  ?General Comments   ?  ?  ? ?Pertinent Vitals/Pain Pain Assessment ?Pain Assessment: Faces ?Faces Pain Scale: Hurts little more ?Pain Location: R knee/thigh ?Pain Descriptors / Indicators: Aching,  Discomfort, Sore, Operative site guarding ?Pain Intervention(s): Limited activity within patient's tolerance, Monitored during session, Repositioned  ? ? ?Home Living   ?  ?  ?  ?  ?  ?  ?  ?  ?  ?   ?  ?Prior Function    ?  ?  ?   ? ?PT Goals (current goals can now be found in the care plan section) Progress towards PT goals: Progressing toward goals ? ?  ?Frequency ? ? ? 7X/week ? ? ? ?  ?PT Plan Current plan remains appropriate  ? ? ?Co-evaluation   ?  ?  ?  ?  ? ?  ?AM-PAC PT "6 Clicks" Mobility   ?Outcome Measure ? Help needed turning from your back to your side while in a flat bed without using bedrails?: A Little ?Help needed moving from lying on your back to sitting on the side of a flat bed without using bedrails?: A Little ?Help needed moving to and from a bed to a chair (including a wheelchair)?: A Little ?Help needed standing up from a chair using your arms (e.g., wheelchair or bedside chair)?: A Little ?Help needed to walk in hospital room?: A Little ?Help needed climbing 3-5 steps with a railing? : A Lot ?6 Click Score: 17 ? ?  ?End of Session Equipment Utilized During Treatment: Gait belt ?Activity Tolerance: Patient tolerated treatment well (still having some nausea and lightheadedness) ?  ?  ?PT Visit Diagnosis: Pain;Other abnormalities of gait and mobility (R26.89) ?Pain - Right/Left: Right ?Pain - part of body: Knee ?  ? ? ?Time: 5027-7412 ?PT Time Calculation (min) (ACUTE ONLY): 36 min ? ?Charges:  $Gait Training: 8-22 mins ?$Therapeutic Exercise: 8-22 mins          ?          ? ? ? ? ?Faye Ramsay, PT ?Acute Rehabilitation  ?Office: [phone] ?Pager: [phone] ? ?  ? ?

## 2021-10-03 NOTE — Progress Notes (Signed)
Patient ID: Nathaniel Bailey, male   DOB: 04/07/1961, 61 y.o.   MRN: [id number] ?There has been no acute changes over the last 24 hours.  He was not cleared for discharge to home yesterday by therapy.  He does not have much assistance apparently at home either.  He mentions something about a friend helping him out.  His right operative knee is stable.  PT can work with him again today with the goal of hopefully discharging when he is cleared physical therapy. ?

## 2021-10-03 NOTE — TOC Transition Note (Signed)
Transition of Care (TOC) - CM/SW Discharge Note ? ? ?Patient Details  ?Name: Mcclellan Demarais ?MRN: [id number] ?Date of Birth: 10-20-1960 ? ?Transition of Care (TOC) CM/SW Contact:  ?Musette Kisamore, LCSW ?Phone Number: ?10/03/2021, 12:51 PM ? ? ?Clinical Narrative:    ?Anticipate pt will dc home today.  Have been able to secure "charity" coverage of HHPT with [facility] for 3 visits (then will consider if extension needed).  RW delivered yesterday to pt's room via [employer].  No further TOC needs. ? ? ?Final next level of care: Home w Home Health Services ?Barriers to Discharge: Barriers Resolved ? ? ?Patient Goals and CMS Choice ?Patient states their goals for this hospitalization and ongoing recovery are:: return home ?  ?  ? ?Discharge Placement ?  ?           ?  ?  ?  ?  ? ?Discharge Plan and Services ?  ?  ?           ?DME Arranged: Walker rolling ?DME Agency: [employer] ?Date DME Agency Contacted: 10/02/21 ?Time DME Agency Contacted: 1030 ?Representative spoke with at DME Agency: Duwayne Heck ?HH Arranged: PT ?HH Agency: [facility] ?Date HH Agency Contacted: 10/03/21 ?Time HH Agency Contacted: 1251 ?Representative spoke with at [facility] Agency: Amy ? ?Social Determinants of Health (SDOH) Interventions ?  ? ? ?Readmission Risk Interventions ?   ? View : No data to display.  ?  ?  ?  ? ? ? ? ? ?

## 2021-10-03 NOTE — Plan of Care (Signed)
?  Problem: Education: ?Goal: Knowledge of the prescribed therapeutic regimen will improve ?Outcome: Progressing ?  ?Problem: Safety: ?Goal: Ability to remain free from injury will improve ?Outcome: Progressing ?  ?Problem: Education: ?Goal: Knowledge of the prescribed therapeutic regimen will improve ?10/03/2021 0246 by Virgilio Belling, RN ?Outcome: Progressing ?10/03/2021 0243 by Virgilio Belling, RN ?Outcome: Progressing ?  ?Problem: Pain Management: ?Goal: Pain level will decrease with appropriate interventions ?Outcome: Progressing ?  ?Problem: Safety: ?Goal: Ability to remain free from injury will improve ?10/03/2021 0246 by Virgilio Belling, RN ?Outcome: Progressing ?10/03/2021 0243 by Virgilio Belling, RN ?Outcome: Progressing ?  ?

## 2021-10-03 NOTE — Progress Notes (Signed)
Occupational Therapy Progress Note ? ?Patient still reporting pain however functionally improved with dressing and toileting tasks. With min cues for sequencing and safety patient able to don lower body clothing using reacher to assist. Patient overall supervision level to ambulate to/from bathroom to void and then wash hands at sink. Provided patient with hip kit to take home with him. Also encourage patient to try and eat more while on pain medication as he states he feels nauseous when he moves but has only eaten a cracker so far today. Patient progressing towards acute OT goals. ? ? ? 10/03/21 1300  ?OT Visit Information  ?Last OT Received On 10/03/21  ?Assistance Needed +1  ?History of Present Illness 61 yo male s/p R TKA 09/29/21.  ?Precautions  ?Precautions Fall;Knee  ?Restrictions  ?Weight Bearing Restrictions No  ?RLE Weight Bearing WBAT  ?Pain Assessment  ?Pain Assessment Faces  ?Faces Pain Scale 4  ?Pain Location R knee/thigh  ?Pain Descriptors / Indicators Aching;Discomfort;Sore;Operative site guarding  ?Pain Intervention(s) Monitored during session  ?Cognition  ?Arousal/Alertness Awake/alert  ?Behavior During Therapy [facility] for tasks assessed/performed  ?Overall Cognitive Status Within Functional Limits for tasks assessed  ?Upper Extremity Assessment  ?Upper Extremity Assessment Overall WFL for tasks assessed  ?ADL  ?Overall ADL's  Needs assistance/impaired  ?Upper Body Dressing  Set up;Sitting  ?Upper Body Dressing Details (indicate cue type and reason) Patient able to don tee shirt seated in chair with set up  ?Lower Body Dressing Supervision/safety;Sitting/lateral leans;Sit to/from stand;Cueing for safety;With adaptive equipment;Cueing for sequencing  ?Lower Body Dressing Details (indicate cue type and reason) Patient needing min cues to thread R LE first when getting dressed. Did utilize reacher to assist with donning underwear. Also educate patient to sit down when initiating donning underwear/shorts  to decrease fall risk.  ?Automotive engineer;Ambulation;Rolling walker (2 wheels)  ?Toilet Transfer Details (indicate cue type and reason) Patient able to ambulate to/from bathroom with rolling walker at supervision level with increased time  ?Toileting- Technical sales engineer;Sit to/from stand  ?Toileting - Clothing Manipulation Details (indicate cue type and reason) Patient able to manage clothing when voiding at toilet.  ?Functional mobility during ADLs Supervision/safety;Rolling walker (2 wheels)  ?General ADL Comments Provided patient with hip kit bag including long handle sponge, shoe horn, reacher and sock aid. Patient familiar with items from previous OT session and return demonstrate use of reacher to assist with lower body dressing this session.  ?Bed Mobility  ?General bed mobility comments oob in recliner  ?Balance  ?Overall balance assessment Needs assistance  ?Sitting-balance support Feet supported  ?Sitting balance-Leahy Scale Good  ?Standing balance support No upper extremity supported;During functional activity  ?Standing balance-Leahy Scale Fair  ?Standing balance comment Can static stand without UE support  ?OT - End of Session  ?Equipment Utilized During [employer] walker (2 wheels)  ?Activity Tolerance Patient tolerated treatment well  ?Patient left in chair;with call bell/phone within reach  ?Nurse Communication Mobility status;Other (comment) ?(report of pain/nausea)  ?OT Assessment/Plan  ?OT Plan Discharge plan remains appropriate  ?OT Visit Diagnosis Unsteadiness on feet (R26.81);Muscle weakness (generalized) (M62.81);Pain  ?Pain - Right/Left Right  ?Pain - part of body Knee  ?OT Frequency (ACUTE ONLY) Min 2X/week  ?Follow Up Recommendations Follow physician's recommendations for discharge plan and follow up therapies  ?Assistance recommended at discharge Intermittent Supervision/Assistance  ?Patient can return home with the following A  little help with walking and/or transfers;A little help with bathing/dressing/bathroom;Assistance with cooking/housework;Help with stairs or ramp  for entrance  ?OT Equipment None recommended by OT  ?AM-PAC OT "6 Clicks" Daily Activity Outcome Measure (Version 2)  ?Help from another person eating meals? 4  ?Help from another person taking care of personal grooming? 3  ?Help from another person toileting, which includes using toliet, bedpan, or urinal? 3  ?Help from another person bathing (including washing, rinsing, drying)? 3  ?Help from another person to put on and taking off regular upper body clothing? 3  ?Help from another person to put on and taking off regular lower body clothing? 3  ?6 Click Score 19  ?Progressive Mobility  ?What is the highest level of mobility based on the progressive mobility assessment? Level 5 (Walks with assist in room/hall) - Balance while stepping forward/back and can walk in room with assist - Complete  ?Activity Ambulated with assistance to bathroom  ?OT Goal Progression  ?Progress towards OT goals Progressing toward goals  ?Acute Rehab OT Goals  ?Patient Stated Goal Less pain  ?OT Goal Formulation With patient  ?Time For Goal Achievement 10/16/21  ?Potential to Achieve Goals Good  ?ADL Goals  ?Pt Will Perform Lower Body Dressing with modified independence;with adaptive equipment;sitting/lateral leans  ?Pt Will Transfer to Toilet with modified independence;ambulating;regular height toilet  ?Pt Will Perform Toileting - Clothing Manipulation and hygiene with modified independence;with adaptive equipment;sitting/lateral leans;sit to/from stand  ?OT Time Calculation  ?OT Start Time (ACUTE ONLY) 1118  ?OT Stop Time (ACUTE ONLY) 1151  ?OT Time Calculation (min) 33 min  ?OT General Charges  ?$OT Visit 1 Visit  ?OT Treatments  ?$Self Care/Home Management  23-37 mins  ? ?Delbert Phenix OT ?OT pager: [phone] ? ?

## 2021-10-05 ENCOUNTER — Telehealth: Payer: Self-pay | Admitting: Orthopaedic Surgery

## 2021-10-05 NOTE — Telephone Encounter (Signed)
Verbal order given  

## 2021-10-05 NOTE — Telephone Encounter (Signed)
[facility] called PT  ?1 time a week for 3 weeks  ? ? ?CB [phone] ?

## 2021-10-07 ENCOUNTER — Other Ambulatory Visit: Payer: Self-pay | Admitting: Orthopaedic Surgery

## 2021-10-12 ENCOUNTER — Ambulatory Visit (INDEPENDENT_AMBULATORY_CARE_PROVIDER_SITE_OTHER): Payer: Self-pay | Admitting: Physician Assistant

## 2021-10-12 ENCOUNTER — Encounter: Payer: Self-pay | Admitting: Physician Assistant

## 2021-10-12 DIAGNOSIS — Z96651 Presence of right artificial knee joint: Secondary | ICD-10-CM

## 2021-10-12 MED ORDER — TIZANIDINE HCL 4 MG PO TABS
4.0000 mg | ORAL_TABLET | Freq: Four times a day (QID) | ORAL | 1 refills | Status: DC | PRN
Start: 2021-10-12 — End: 2021-11-09

## 2021-10-12 MED ORDER — OXYCODONE HCL 5 MG PO TABS: 5.0000 mg | ORAL_TABLET | Freq: Four times a day (QID) | ORAL | 0 refills | Status: DC | PRN

## 2021-10-12 NOTE — Progress Notes (Signed)
?  HPI: [name] returns today 2 weeks status post right total knee arthroplasty.  He states he is in a lot of pain.  He has been out of pain medication since Saturday reports that he called his [employer] they told him to contact our office he did not contact our office.  He has had no shortness of breath chest pain.  He unfortunately has been taking his aspirin just for nausea he was unaware that he needed to take it for DVT prophylaxis.  He is on no aspirin prior to surgery. ? ?Physical exam: General well-developed well-nourished male no acute distress.  Surgical incisions healing well no signs of dehiscence or wound infection.  Right calf supple nontender.  Has full extension of the knee actively and 85 degrees of flexion.  Dorsiflexion plantarflexion right ankle intact. ? ? ?Impression: Status post right total knee arthroplasty ? ?Plan: Discussed with patient using interpreter today that he needed to stay on the aspirin once daily for another week and then discontinue as he is on no aspirin prior to surgery.  We will call in his Roxicodone and muscle relaxant.  Staples were removed today Steri-Strips applied.  He will work on scar tissue mobilization.  He will wash the incision and shower and will not submerge it.  He is given information to contact [facility] in [facility] and a prescription for outpatient therapy for range of motion strengthening of the right knee.  See him back in 1 month sooner if there is any questions concerns.  Questions were encouraged and answered at length today using  interpreter. ? ? ? ? ? ? ? ? ? ? ? ? ? ? ? ? ? ? ? ? ? ? ? ? ? ? ? ? ? ? ? ? ? ? ? ? ? ? ? ? ? ? ? ? ? ? ? ? ? ? ? ? ? ? ? ? ? ? ? ? ? ? ? ? ? ? ? ? ? ? ? ? ? ? ? ? ? ? ? ? ? ? ? ? ? ? ? ? ? ? ? ? ? ? ? ? ? ? ? ? ? ? ? ? ? ? ? ? ? ? ? ? ? ? ? ? ? ? ? ? ? ? ? ? ? ? ? ? ? ? ? ? ? ? ? ? ? ? ? ? ? ? ? ? ? ? ? ? ? ? ? ? ? ? ? ? ? ? ? ? ? ? ? ? ? ? ? ? ? ? ? ? ? ? ? ? ? ? ? ? ? ? ? ? ? ? ? ? ? ? ? ? ? ? ? ? ? ? ? ? ? ? ? ? ? ? ? ? ? ? ? ? ?+ ? ? ? ? ? ? ? ? ? ? ? ? ? ? ? ? ? ? ? ? ? ? ? ?

## 2021-10-16 ENCOUNTER — Telehealth: Payer: Self-pay | Admitting: Orthopaedic Surgery

## 2021-10-16 ENCOUNTER — Telehealth: Payer: Self-pay | Admitting: Physician Assistant

## 2021-10-16 ENCOUNTER — Other Ambulatory Visit: Payer: Self-pay | Admitting: Orthopaedic Surgery

## 2021-10-16 DIAGNOSIS — Z96651 Presence of right artificial knee joint: Secondary | ICD-10-CM

## 2021-10-16 MED ORDER — OXYCODONE HCL 5 MG PO TABS: 5.0000 mg | ORAL_TABLET | Freq: Four times a day (QID) | ORAL | 0 refills | Status: DC | PRN

## 2021-10-16 NOTE — Addendum Note (Signed)
Addended by: Barbette Or on: 10/16/2021 01:18 PM ? ? Modules accepted: Orders ? ?

## 2021-10-16 NOTE — Telephone Encounter (Signed)
Pt called wanting to sch an appt with physical therapy. There is no referral for therapy  ?

## 2021-10-16 NOTE — Telephone Encounter (Signed)
Order in chart now

## 2021-10-16 NOTE — Telephone Encounter (Signed)
Pt called requesting refill of oxycodone. Please send to pharmacy on file.Marland Kitchen Pt phone number is [phone]. ?

## 2021-10-18 ENCOUNTER — Telehealth: Payer: Self-pay | Admitting: Orthopaedic Surgery

## 2021-10-18 NOTE — Telephone Encounter (Signed)
Celeste (PT) from [facility] called with updates for pt. Discharged pt from home health pt. Pt states to be depressed and it has been more than 14 days feeling this way. Celeste states pt has no PCP to informed and wanted to inform Dr. Magnus Ivan. Celeste phone number is [phone]. ?

## 2021-10-18 NOTE — Telephone Encounter (Signed)
FYI

## 2021-10-20 ENCOUNTER — Other Ambulatory Visit: Payer: Self-pay | Admitting: Orthopaedic Surgery

## 2021-10-20 ENCOUNTER — Telehealth: Payer: Self-pay | Admitting: Physician Assistant

## 2021-10-20 ENCOUNTER — Other Ambulatory Visit: Payer: Self-pay

## 2021-10-20 DIAGNOSIS — Z96651 Presence of right artificial knee joint: Secondary | ICD-10-CM

## 2021-10-20 MED ORDER — OXYCODONE HCL 5 MG PO TABS: 5.0000 mg | ORAL_TABLET | Freq: Four times a day (QID) | ORAL | 0 refills | Status: DC | PRN

## 2021-10-20 NOTE — Telephone Encounter (Signed)
Patient called needing Rx refilled Oxycodone 5 mg and Asprin 81 mg. Patient also said [facility] (PT) would not see him without a referral. The number to contact patient is [phone] ?

## 2021-10-20 NOTE — Telephone Encounter (Signed)
Please advise 

## 2021-10-20 NOTE — Telephone Encounter (Signed)
I talked to pt using the interpreter system and advised him. Order for [facility] PT placed in chart  ?

## 2021-10-20 NOTE — Telephone Encounter (Signed)
Pt called about update on medication refill. Please call pt at [phone]. ?

## 2021-10-20 NOTE — Telephone Encounter (Signed)
Please advise on medications

## 2021-10-23 ENCOUNTER — Other Ambulatory Visit: Payer: Self-pay | Admitting: Physician Assistant

## 2021-10-23 MED ORDER — OXYCODONE HCL 5 MG PO TABS: 5.0000 mg | ORAL_TABLET | Freq: Four times a day (QID) | ORAL | 0 refills | Status: DC | PRN

## 2021-10-23 NOTE — Telephone Encounter (Signed)
Already called pt on friday ?

## 2021-10-24 ENCOUNTER — Telehealth: Payer: Self-pay | Admitting: Orthopaedic Surgery

## 2021-10-24 ENCOUNTER — Encounter: Payer: Self-pay | Admitting: Physical Therapy

## 2021-10-24 ENCOUNTER — Other Ambulatory Visit: Payer: Self-pay

## 2021-10-24 ENCOUNTER — Ambulatory Visit (INDEPENDENT_AMBULATORY_CARE_PROVIDER_SITE_OTHER): Payer: Self-pay | Admitting: Physical Therapy

## 2021-10-24 DIAGNOSIS — R600 Localized edema: Secondary | ICD-10-CM

## 2021-10-24 DIAGNOSIS — R2681 Unsteadiness on feet: Secondary | ICD-10-CM

## 2021-10-24 DIAGNOSIS — M25561 Pain in right knee: Secondary | ICD-10-CM

## 2021-10-24 DIAGNOSIS — M25661 Stiffness of right knee, not elsewhere classified: Secondary | ICD-10-CM

## 2021-10-24 DIAGNOSIS — R2689 Other abnormalities of gait and mobility: Secondary | ICD-10-CM

## 2021-10-24 DIAGNOSIS — M6281 Muscle weakness (generalized): Secondary | ICD-10-CM

## 2021-10-24 NOTE — Therapy (Signed)
?OUTPATIENT PHYSICAL THERAPY LOWER EXTREMITY EVALUATION ? ? ?Patient Name: Nathaniel Bailey ?MRN: [id number] ?DOB:[birth year], 61 y.o., male ?Today's Date: 10/24/2021 ? ? PT End of Session - 10/24/21 1651   ? ? Visit Number 1   ? Number of Visits 18   ? Date for PT Re-Evaluation 12/15/21   ? PT Start Time 1349   ? PT Stop Time 1433   ? PT Time Calculation (min) 44 min   ? Activity Tolerance Patient limited by pain   ? Behavior During Therapy [facility] for tasks assessed/performed   ? ?  ?  ? ?  ? ? ?Past Medical History:  ?Diagnosis Date  ? Arthritis   ? Headache   ? Hypertension   ? NO MEDS , patient reports sometimes elevated   ? Pre-diabetes   ? Pt denies. A1c was 7.8 06/13/21.  ? ?Past Surgical History:  ?Procedure Laterality Date  ? KNEE ARTHROSCOPY Left 03/29/2020  ? Procedure: LEFT KNEE ARTHROSCOPY WITH DEBRIDEMENT AND PARTIAL MEDIAL MENISCECTOMY;  Surgeon: Kathryne Hitch, MD;  Location: [facility];  Service: Orthopedics;  Laterality: Left;  ? KNEE ARTHROSCOPY WITH MEDIAL MENISECTOMY Right 10/17/2017  ? Procedure: RIGHT KNEE ARTHROSCOPY WITH PARTIAL MEDIAL MENISCECTOMY AND DEBRIDEMENT;  Surgeon: Kathryne Hitch, MD;  Location: [facility];  Service: Orthopedics;  Laterality: Right;  ? TOTAL KNEE ARTHROPLASTY Right 09/29/2021  ? Procedure: Right TOTAL KNEE ARTHROPLASTY;  Surgeon: Kathryne Hitch, MD;  Location: [facility];  Service: Orthopedics;  Laterality: Right;  ? ?Patient Active Problem List  ? Diagnosis Date Noted  ? Status post total knee replacement, right 10/01/2021  ? Status post total right knee replacement 09/29/2021  ? S/P revision of total knee, right 09/29/2021  ? Unilateral primary osteoarthritis, right knee 08/31/2020  ? Status post arthroscopy of left knee 04/05/2020  ? Other tear of medial meniscus, current injury, left knee, subsequent encounter 03/29/2020  ? Status post arthroscopy of right knee 10/28/2017  ? Medial meniscus, posterior horn derangement, right  10/17/2017  ? ? ?PCP: Pcp, No ? ?REFERRING PROVIDER: Kirtland Bouchard, PA-C ? ?REFERRING DIAG: W09.811 (ICD-10-CM) - Status post total right knee replacement ? ?THERAPY DIAG:  ?Acute pain of right knee ? ?Stiffness of right knee, not elsewhere classified ? ?Muscle weakness (generalized) ? ?Localized edema ? ?Other abnormalities of gait and mobility ? ?Unsteadiness on feet ? ?ONSET DATE: 09/29/2021 right TKA ? ?SUBJECTIVE:  ? ?SUBJECTIVE STATEMENT: ?He developed knee issues ~10 years. He underwent right TKA on 09/29/2021. HHPT for 3 visits thru last week.   ? ?PERTINENT HISTORY: ?Left meniscus tear & repair, right mensicus tear & repair, OA, HTN, pre-daibetes ? ?PAIN:  ?Are you having pain? Yes: NPRS scale: 7-8/10 today and in last week lowest 6-7/10 & highest 9/10 ?Pain location: right knee from hip to heel more on sides & in joint ?Pain description: constant  sharp & achy, ?Aggravating factors: at night, doing exercises, moving knee ?Relieving factors: sitting, meds, ice, elevation ? ?PRECAUTIONS: None ? ?WEIGHT BEARING RESTRICTIONS No ? ?FALLS:  ?Has patient fallen in last 6 months? No ? ?LIVING ENVIRONMENT: ?Lives with: lives alone ?Lives in: House/apartment ?Stairs: No ?Has following equipment at home: Single point cane, Walker - 2 wheeled, and Crutches ? ?OCCUPATION: out of work for year, was employed at [employer] ? ?PLOF: Independent, Independent with household mobility without device, and Independent with community mobility without device ? ?PATIENT GOALS walk without pain or device ? ? ?OBJECTIVE:  ? ?  DIAGNOSTIC FINDINGS: 09/29/2021 Immediate postprocedural changes from RIGHT total knee arthroplasty ?are demonstrated. Femoral and tibial components appear well position ?without signs of immediate postprocedural complication. Skin staples ?over the midline anterior knee. ? ?PATIENT SURVEYS:  ?FOTO Needs to be completed on visit 2 ? ?COGNITION: ?Overall cognitive status: Within functional limits  for tasks assessed   ?  ?EDEMA: ?RLE: above knee 43.6cm around knee 43cm below knee 36.8cm ?LLE: above knee 36.3cm around knee 36cm below knee 31.8cm ? ?POSTURE:  ?Stands with weight shifted to left, right knee flexed with partial weight ? ?PALPATION: ?Tenderness over right knee joint line, incision, quadriceps, hamstrings & gastroc. ?Scar mobility limited over patella and proximal tibia.  ? ?LE ROM: ? ?ROM ?P:passive  A:active Right ?10/24/2021 Left ?10/24/2021  ?Hip flexion    ?Hip extension    ?Hip abduction    ?Hip adduction    ?Hip internal rotation    ?Hip external rotation    ?Knee flexion Supine P:  ?82* ?Seated A: 73*   ?Knee extension Supine P:  ? ?A: -12* LAQ   ?Ankle dorsiflexion    ?Ankle plantarflexion    ?Ankle inversion    ?Ankle eversion    ? (Blank rows = not tested) ? ?LE MMT: ? ?MMT Right ?10/24/2021 Left ?10/24/2021  ?Hip flexion    ?Hip extension    ?Hip abduction    ?Hip adduction    ?Hip internal rotation    ?Hip external rotation    ?Knee flexion 3-/5   ?Knee extension 3-/5   ?Ankle dorsiflexion    ?Ankle plantarflexion    ?Ankle inversion    ?Ankle eversion    ? (Blank rows = not tested) ? ?GAIT: ?Distance walked: 100' ?Assistive device utilized: Environmental consultant - 2 wheeled ?Level of assistance: SBA ?Comments: antalgic, limited weight bearing RLE, right knee flexed in stance with minimal change in range for swing. ? ? ? ?TODAY'S TREATMENT: ?PT instructed in HEP with pt performing 1 set of each for comprehension.  ? ? ?PATIENT EDUCATION:  ?Education details: Access Code: [email or web address] ?Person educated: Patient ?Education method: Explanation, Demonstration, Tactile cues, Verbal cues, and Handouts ?Education comprehension: verbalized understanding, returned demonstration, verbal cues required, tactile cues required, and needs further education ? ? ?HOME EXERCISE PROGRAM: ?Access Code: [email or web address] ?URL: [email or web address] ?Date: 10/24/2021 ?Prepared by: Vladimir Faster ? ?Exercises ?- Quad Setting  and Stretching  - 2-4 x daily - 7 x weekly - 5-10 sets - 10 reps - prop 5-10 minutes & quad set5 seconds hold ?- Supine Heel Slide with Strap  - 2-3 x daily - 7 x weekly - 2-3 sets - 10 reps - 5 seconds hold ?- Supine Straight Leg Raises  - 2-3 x daily - 7 x weekly - 2-3 sets - 10 reps - 5 seconds hold ?- Seated Knee Flexion Extension AROM   - 2-4 x daily - 7 x weekly - 2-3 sets - 10 reps - 5 seconds hold ?- Straight leg raise seated in chair  - 1 x daily - 5 x weekly - 1 sets - 10 reps - 5 seconds hold ? ?ASSESSMENT: ? ?CLINICAL IMPRESSION: ?Patient is a 61 y.o. male who was seen today for physical therapy evaluation and treatment for right TKA. He demonstrates decreased strength, ROM, increased edema and pain with gait abnormalities affecting functional mobility. ? ?OBJECTIVE IMPAIRMENTS Abnormal gait, decreased activity tolerance, decreased balance, decreased knowledge of condition, decreased knowledge of use of DME, decreased mobility, decreased ROM, decreased strength, increased edema, postural dysfunction,  and pain.  ? ?ACTIVITY LIMITATIONS community activity.  ? ?PERSONAL FACTORS 1-2 comorbidities: see PMH  are also affecting patient's functional outcome.  ? ? ?REHAB POTENTIAL: Good ? ?CLINICAL DECISION MAKING: Stable/uncomplicated ? ?EVALUATION COMPLEXITY: Low ? ? ?GOALS: ?Goals reviewed with patient?  ? ?SHORT TERM GOALS: Target date: 11/17/2021 ? ?Patient independent and verbalizes compliance with initial HEP ?Baseline: SEE OBJECTIVE DATA ?Goal status: INITIAL ? ?2.  Patient reports 50% improvement in right knee pain. ?Baseline: SEE OBJECTIVE DATA ?Goal status: INITIAL ? ?3.  PROM right knee extension -3* to flexion 90* ?Baseline: SEE OBJECTIVE DATA ?Goal status: INITIAL ? ?LONG TERM GOALS: Target date: 12/15/2021 ? ?Patient will improve FOTO score to target% ?Baseline: SEE OBJECTIVE DATA ?Goal status: INITIAL ? ?2.  Patient reports right knee pain </= 2/10 with standing & gait activities.  ?Baseline: SEE  OBJECTIVE DATA ?Goal status: INITIAL ? ?3.  Right Knee PROM 0* extension to 100* flexion ?Baseline: SEE OBJECTIVE DATA ?Goal status: INITIAL ? ?4.  Right Knee AROM seated -3* extension to 90* flexion ?[place]

## 2021-10-24 NOTE — Telephone Encounter (Signed)
Lvm for pt via language service informing him of this ?

## 2021-10-24 NOTE — Telephone Encounter (Signed)
Patient would like a refill on his pain medication. CB# IS [phone] ?

## 2021-10-24 NOTE — Telephone Encounter (Signed)
Bronson Curb sent this in yesterday ?

## 2021-10-30 ENCOUNTER — Telehealth: Payer: Self-pay

## 2021-10-30 ENCOUNTER — Other Ambulatory Visit: Payer: Self-pay | Admitting: Orthopaedic Surgery

## 2021-10-30 MED ORDER — OXYCODONE HCL 5 MG PO TABS
5.0000 mg | ORAL_TABLET | Freq: Four times a day (QID) | ORAL | 0 refills | Status: DC | PRN
Start: 2021-10-30 — End: 2021-11-03

## 2021-10-30 NOTE — Telephone Encounter (Signed)
Please advise 

## 2021-10-30 NOTE — Telephone Encounter (Signed)
Patient would like a RF on pain meds (Oxy). Uses [facility]. ? ? ?CB [phone] ? ? ? ?

## 2021-11-01 ENCOUNTER — Encounter: Payer: Self-pay | Admitting: Physical Therapy

## 2021-11-01 ENCOUNTER — Ambulatory Visit (INDEPENDENT_AMBULATORY_CARE_PROVIDER_SITE_OTHER): Payer: Self-pay | Admitting: Physical Therapy

## 2021-11-01 DIAGNOSIS — R2681 Unsteadiness on feet: Secondary | ICD-10-CM

## 2021-11-01 DIAGNOSIS — M25661 Stiffness of right knee, not elsewhere classified: Secondary | ICD-10-CM

## 2021-11-01 DIAGNOSIS — R600 Localized edema: Secondary | ICD-10-CM

## 2021-11-01 DIAGNOSIS — M25561 Pain in right knee: Secondary | ICD-10-CM

## 2021-11-01 DIAGNOSIS — M6281 Muscle weakness (generalized): Secondary | ICD-10-CM

## 2021-11-01 DIAGNOSIS — R2689 Other abnormalities of gait and mobility: Secondary | ICD-10-CM

## 2021-11-01 NOTE — Therapy (Signed)
?OUTPATIENT PHYSICAL THERAPY TREATMENT NOTE ? ? ?Patient Name: Nathaniel Bailey ?MRN: [id number] ?DOB:Jan 25, 1961, 61 y.o., male ?Today's Date: 11/01/2021 ? ?PCP: Pcp, No ?REFERRING PROVIDER: Kirtland Bouchard, PA-C ? ?END OF SESSION:  ? PT End of Session - 11/01/21 1307   ? ? Visit Number 2   ? Number of Visits 18   ? Date for PT Re-Evaluation 12/15/21   ? PT Start Time 1304   ? PT Stop Time 1344   ? PT Time Calculation (min) 40 min   ? Activity Tolerance Patient limited by pain   ? Behavior During Therapy [facility] for tasks assessed/performed   ? ?  ?  ? ?  ? ? ?Past Medical History:  ?Diagnosis Date  ? Arthritis   ? Headache   ? Hypertension   ? NO MEDS , patient reports sometimes elevated   ? Pre-diabetes   ? Pt denies. A1c was 7.8 06/13/21.  ? ?Past Surgical History:  ?Procedure Laterality Date  ? KNEE ARTHROSCOPY Left 03/29/2020  ? Procedure: LEFT KNEE ARTHROSCOPY WITH DEBRIDEMENT AND PARTIAL MEDIAL MENISCECTOMY;  Surgeon: Kathryne Hitch, MD;  Location: [facility];  Service: Orthopedics;  Laterality: Left;  ? KNEE ARTHROSCOPY WITH MEDIAL MENISECTOMY Right 10/17/2017  ? Procedure: RIGHT KNEE ARTHROSCOPY WITH PARTIAL MEDIAL MENISCECTOMY AND DEBRIDEMENT;  Surgeon: Kathryne Hitch, MD;  Location: [facility];  Service: Orthopedics;  Laterality: Right;  ? TOTAL KNEE ARTHROPLASTY Right 09/29/2021  ? Procedure: Right TOTAL KNEE ARTHROPLASTY;  Surgeon: Kathryne Hitch, MD;  Location: [facility];  Service: Orthopedics;  Laterality: Right;  ? ?Patient Active Problem List  ? Diagnosis Date Noted  ? Status post total knee replacement, right 10/01/2021  ? Status post total right knee replacement 09/29/2021  ? S/P revision of total knee, right 09/29/2021  ? Unilateral primary osteoarthritis, right knee 08/31/2020  ? Status post arthroscopy of left knee 04/05/2020  ? Other tear of medial meniscus, current injury, left knee, subsequent encounter 03/29/2020  ? Status post arthroscopy of right knee  10/28/2017  ? Medial meniscus, posterior horn derangement, right 10/17/2017  ? ? ?REFERRING DIAG: Z96.651 (ICD-10-CM) - Status post total right knee replacement ? ?THERAPY DIAG:  ?Acute pain of right knee ? ?Stiffness of right knee, not elsewhere classified ? ?Muscle weakness (generalized) ? ?Localized edema ? ?Other abnormalities of gait and mobility ? ?Unsteadiness on feet ? ?PERTINENT HISTORY: Left meniscus tear & repair, right mensicus tear & repair, OA, HTN, pre-daibetes ? ?PRECAUTIONS: None ? ?SUBJECTIVE: knee is doing okay ? ?PAIN:  ?Are you having pain? Yes: NPRS scale: 6/10 ?Pain location: Rt knee ?Pain description: aching, sore ?Aggravating factors: bending, standing/walking ?Relieving factors: medication ? ? ?OBJECTIVE: (objective measures completed at initial evaluation unless otherwise dated) ? ? ?PATIENT SURVEYS:  ?11/01/21 FOTO  54 (predicted 68) ?            ?EDEMA: ?RLE: above knee 43.6cm around knee 43cm below knee 36.8cm ?LLE: above knee 36.3cm around knee 36cm below knee 31.8cm ?  ?POSTURE:  ?Stands with weight shifted to left, right knee flexed with partial weight ?  ?PALPATION: ?Tenderness over right knee joint line, incision, quadriceps, hamstrings & gastroc. ?Scar mobility limited over patella and proximal tibia.  ?  ?LE ROM: ?  ?ROM ?P:passive  A:active Right ?10/24/2021 Left ?10/24/2021 Right ?11/01/21  ?Knee flexion Supine P:  ?82* ?Seated A: 73*   Supine: ?AA 97  ?Knee extension Supine P:  ?  ?A: -12* LAQ     ? (  Blank rows = not tested) ?  ?LE MMT: ?  ?MMT Right ?10/24/2021 Left ?10/24/2021  ?Knee flexion 3-/5    ?Knee extension 3-/5    ? (Blank rows = not tested) ?  ?GAIT: ?Distance walked: 100' ?Assistive device utilized: Environmental consultantWalker - 2 wheeled ?Level of assistance: SBA ?Comments: antalgic, limited weight bearing RLE, right knee flexed in stance with minimal change in range for swing. ?  ?  ?  ?TODAY'S TREATMENT: ?11/01/21 ?Therex: ?     Aerobic: ?NuStep L5 x 8 min ?    Sitting: ?Active knee  flexion 10 x 5 sec hold; Rt ?SLR with min cues for quad activation x10 reps; Rt ?Long sitting quad set 10 x 5 sec hold; Rt ?    Supine: ?Rt SLR x 10 reps ?Rt AA heelslides 10x5 sec hold; Rt ? ? ? ? ? ?10/24/21: PT instructed in HEP with pt performing 1 set of each for comprehension.  ?  ?  ?PATIENT EDUCATION:  ?Education details: Access Code: [email or web address] ?Person educated: Patient ?Education method: Explanation, Demonstration, Tactile cues, Verbal cues, and Handouts ?Education comprehension: verbalized understanding, returned demonstration, verbal cues required, tactile cues required, and needs further education ?  ?  ?HOME EXERCISE PROGRAM: ?Access Code: [email or web address] ?URL: [email or web address] ?Date: 10/24/2021 ?Prepared by: Vladimir Fasterobin Waldron ?  ?Exercises ?- Quad Setting and Stretching  - 2-4 x daily - 7 x weekly - 5-10 sets - 10 reps - prop 5-10 minutes & quad set5 seconds hold ?- Supine Heel Slide with Strap  - 2-3 x daily - 7 x weekly - 2-3 sets - 10 reps - 5 seconds hold ?- Supine Straight Leg Raises  - 2-3 x daily - 7 x weekly - 2-3 sets - 10 reps - 5 seconds hold ?- Seated Knee Flexion Extension AROM   - 2-4 x daily - 7 x weekly - 2-3 sets - 10 reps - 5 seconds hold ?- Straight leg raise seated in chair  - 1 x daily - 5 x weekly - 1 sets - 10 reps - 5 seconds hold ?  ?ASSESSMENT: ?  ?CLINICAL IMPRESSION: ?Pt with improvement in flexion noted today and hopfully will conitnue to improve.  Pt provided with [facility] financial assistance forms and he has family member that can assist.  Will continue to benefit from PT to maximize function. ? ?OBJECTIVE IMPAIRMENTS Abnormal gait, decreased activity tolerance, decreased balance, decreased knowledge of condition, decreased knowledge of use of DME, decreased mobility, decreased ROM, decreased strength, increased edema, postural dysfunction, and pain.  ?  ?ACTIVITY LIMITATIONS community activity.  ?  ?PERSONAL FACTORS 1-2 comorbidities: see PMH  are also affecting  patient's functional outcome.  ?  ?  ?REHAB POTENTIAL: Good ?  ?CLINICAL DECISION MAKING: Stable/uncomplicated ?  ?EVALUATION COMPLEXITY: Low ?  ?  ?GOALS: ?Goals reviewed with patient?  ?  ?SHORT TERM GOALS: Target date: 11/17/2021 ?  ?Patient independent and verbalizes compliance with initial HEP ?Baseline: SEE OBJECTIVE DATA ?Goal status: INITIAL ?  ?2.  Patient reports 50% improvement in right knee pain. ?Baseline: SEE OBJECTIVE DATA ?Goal status: INITIAL ?  ?3.  PROM right knee extension -3* to flexion 90* ?Baseline: SEE OBJECTIVE DATA ?Goal status: INITIAL ?  ?LONG TERM GOALS: Target date: 12/15/2021 ?  ?Patient will improve FOTO score to target% ?Baseline: SEE OBJECTIVE DATA ?Goal status: INITIAL ?  ?2.  Patient reports right knee pain </= 2/10 with standing & gait activities.  ?Baseline: SEE OBJECTIVE DATA ?Goal status: INITIAL ?  ?3.  Right Knee PROM 0* extension to 100* flexion ?Baseline: SEE OBJECTIVE DATA ?Goal status: INITIAL ?  ?4.  Right Knee AROM seated -3* extension to 90* flexion ?Baseline: SEE OBJECTIVE DATA ?Goal status: INITIAL ?  ?5.  Patient ambulates >500' community distances including negotiating ramps, curbs & stairs without device independently. ?Baseline: SEE OBJECTIVE DATA ?Goal status: INITIAL ?  ?  ?PLAN: ?PT FREQUENCY 2x/wk for 1 week, then 3x/week for 2 weeks, then 2x/wk for 5 weeks ?  ?PT DURATION: 8 weeks ?  ?PLANNED INTERVENTIONS: Therapeutic exercises, Therapeutic activity, Neuromuscular re-education, Balance training, Gait training, Patient/Family education, Joint mobilization, Stair training, DME instructions, Electrical stimulation, Cryotherapy, Moist heat, scar mobilization, Taping, Vasopneumatic device, and Manual therapy ?  ?PLAN FOR NEXT SESSION: manual therapy & therapeutic exercise for knee range, end with vaso PRN ? ? ? ? ?Clarita Crane, PT, DPT ?11/01/21 1:46 PM ? ? ? ?  ? ?

## 2021-11-03 ENCOUNTER — Encounter: Payer: Self-pay | Admitting: Rehabilitative and Restorative Service Providers"

## 2021-11-03 ENCOUNTER — Telehealth: Payer: Self-pay | Admitting: Orthopaedic Surgery

## 2021-11-03 ENCOUNTER — Ambulatory Visit (INDEPENDENT_AMBULATORY_CARE_PROVIDER_SITE_OTHER): Payer: Self-pay | Admitting: Rehabilitative and Restorative Service Providers"

## 2021-11-03 ENCOUNTER — Telehealth: Payer: Self-pay

## 2021-11-03 ENCOUNTER — Other Ambulatory Visit: Payer: Self-pay | Admitting: Orthopaedic Surgery

## 2021-11-03 DIAGNOSIS — M25561 Pain in right knee: Secondary | ICD-10-CM

## 2021-11-03 DIAGNOSIS — R600 Localized edema: Secondary | ICD-10-CM

## 2021-11-03 DIAGNOSIS — M25661 Stiffness of right knee, not elsewhere classified: Secondary | ICD-10-CM

## 2021-11-03 DIAGNOSIS — R2689 Other abnormalities of gait and mobility: Secondary | ICD-10-CM

## 2021-11-03 DIAGNOSIS — M6281 Muscle weakness (generalized): Secondary | ICD-10-CM

## 2021-11-03 MED ORDER — OXYCODONE HCL 5 MG PO TABS: 5.0000 mg | ORAL_TABLET | Freq: Four times a day (QID) | ORAL | 0 refills | Status: DC | PRN

## 2021-11-03 NOTE — Telephone Encounter (Signed)
[facility] called and wanted to make [place] aware that pt is taking 2 oxy every 6 hrs. I approved them to fill rx early but they requested we discuss this with pt at appt next week. They also suggested we start him on an nsaid ?

## 2021-11-03 NOTE — Telephone Encounter (Signed)
Pt called requesting oxycodone. Please send to pharmacy on file. Pt phone number is [phone]. Pt said please call him when medication is called he states he knows alittle bit of English language ?

## 2021-11-03 NOTE — Telephone Encounter (Signed)
Patient is here for PT. He would like a refill on oxycodone.  ?

## 2021-11-03 NOTE — Therapy (Addendum)
OUTPATIENT PHYSICAL THERAPY TREATMENT/DISCHARGE NOTE   Patient Name: Nathaniel Bailey MRN: [id number] DOB:[birth year], 61 y.o., male Today's Date: 11/03/2021  PCP: Merryl Hacker, No REFERRING PROVIDER: Pete Pelt, PA-C  PHYSICAL THERAPY DISCHARGE SUMMARY  Visits from Start of Care: 3  Current functional level related to goals / functional outcomes: See note   Remaining deficits: See note   Education / Equipment: HEP   Patient agrees to discharge. Patient goals were  On Going . Patient is being discharged due to not returning since the last visit.    END OF SESSION:   PT End of Session - 11/03/21 1401     Visit Number 3    Number of Visits 18    Date for PT Re-Evaluation 12/15/21    PT Start Time 0935    PT Stop Time 1025    PT Time Calculation (min) 50 min    Activity Tolerance Patient tolerated treatment well    Behavior During Therapy WFL for tasks assessed/performed              Past Medical History:  Diagnosis Date   Arthritis    Headache    Hypertension    NO MEDS , patient reports sometimes elevated    Pre-diabetes    Pt denies. A1c was 7.8 06/13/21.   Past Surgical History:  Procedure Laterality Date   KNEE ARTHROSCOPY Left 03/29/2020   Procedure: LEFT KNEE ARTHROSCOPY WITH DEBRIDEMENT AND PARTIAL MEDIAL MENISCECTOMY;  Surgeon: Mcarthur Rossetti, MD;  Location: [place];  Service: Orthopedics;  Laterality: Left;   KNEE ARTHROSCOPY WITH MEDIAL MENISECTOMY Right 10/17/2017   Procedure: RIGHT KNEE ARTHROSCOPY WITH PARTIAL MEDIAL MENISCECTOMY AND DEBRIDEMENT;  Surgeon: Mcarthur Rossetti, MD;  Location: [facility];  Service: Orthopedics;  Laterality: Right;   TOTAL KNEE ARTHROPLASTY Right 09/29/2021   Procedure: Right TOTAL KNEE ARTHROPLASTY;  Surgeon: Mcarthur Rossetti, MD;  Location: [facility];  Service: Orthopedics;  Laterality: Right;   Patient Active Problem List   Diagnosis Date Noted   Status post total knee  replacement, right 10/01/2021   Status post total right knee replacement 09/29/2021   S/P revision of total knee, right 09/29/2021   Unilateral primary osteoarthritis, right knee 08/31/2020   Status post arthroscopy of left knee 04/05/2020   Other tear of medial meniscus, current injury, left knee, subsequent encounter 03/29/2020   Status post arthroscopy of right knee 10/28/2017   Medial meniscus, posterior horn derangement, right 10/17/2017    REFERRING DIAG: Z96.651 (ICD-10-CM) - Status post total right knee replacement  THERAPY DIAG:  Other abnormalities of gait and mobility  Stiffness of right knee, not elsewhere classified  Acute pain of right knee  Localized edema  Muscle weakness (generalized)  PERTINENT HISTORY: Left meniscus tear & repair, right mensicus tear & repair, OA, HTN, pre-daibetes  PRECAUTIONS: None  SUBJECTIVE: Nathaniel Bailey reports progress with his early PT.  Pain is under control with medications and he can sleep 3-4 hours uninterrupted.  PAIN:  Are you having pain? Yes: NPRS scale: 3-6/10 Pain location: Rt knee Pain description: aching, sore Aggravating factors: bending, standing/walking Relieving factors: medication   OBJECTIVE: (objective measures completed at initial evaluation unless otherwise dated)   PATIENT SURVEYS:  11/01/21 FOTO  20 (predicted 24)             EDEMA: RLE: above knee 43.6cm around knee 43cm below knee 36.8cm LLE: above knee 36.3cm around knee 36cm below knee 31.8cm   POSTURE:  Stands with  weight shifted to left, right knee flexed with partial weight   PALPATION: Tenderness over right knee joint line, incision, quadriceps, hamstrings & gastroc. Scar mobility limited over patella and proximal tibia.    LE ROM:   ROM P:passive  A:active Right 10/24/2021 Left 10/24/2021 Right 11/01/21  Knee flexion Supine P:  82* Seated A: 73*   Supine: AA 97  Knee extension Supine P:    A: -12* LAQ      (Blank rows = not tested)    LE MMT:   MMT Right 10/24/2021 Left 10/24/2021  Knee flexion 3-/5    Knee extension 3-/5     (Blank rows = not tested)   GAIT: Distance walked: 100' Assistive device utilized: Environmental consultant - 2 wheeled Level of assistance: SBA Comments: antalgic, limited weight bearing RLE, right knee flexed in stance with minimal change in range for swing.       TODAY'S TREATMENT: 11/03/21 Therapeutic Exercises: Nu Step Level 5 Seat back for extension 4 minutes and Seat up for flexion 4 minutes  Tailgate knee flexion 1 minute  AAROM R knee (L knee pushes R into flexion) 10X 10 seconds  Prone knee extension stretch 3 minutes  Supine knee extension stretch 3 minutes  Recumbent Bike Seat 7 (AAROM then full range) 8 minutes  Vasopneumatic R knee 10 minutes Medium pressure 34*   11/01/21 Therex:      Aerobic: NuStep L5 x 8 min     Sitting: Active knee flexion 10 x 5 sec hold; Rt SLR with min cues for quad activation x10 reps; Rt Long sitting quad set 10 x 5 sec hold; Rt     Supine: Rt SLR x 10 reps Rt AA heelslides 10x5 sec hold; Rt   10/24/21: PT instructed in HEP with pt performing 1 set of each for comprehension.      PATIENT EDUCATION:   Access Code: [email or web address] URL: [email or web address] Date: 11/03/2021 Prepared by: Vista Mink  Exercises - Quad Setting and Stretching  - 2-4 x daily - 7 x weekly - 5-10 sets - 10 reps - prop 5-10 minutes & quad set5 seconds hold - Supine Heel Slide with Strap  - 2-3 x daily - 7 x weekly - 2-3 sets - 10 reps - 5 seconds hold - Supine Straight Leg Raises  - 2-3 x daily - 7 x weekly - 2-3 sets - 10 reps - 5 seconds hold - Seated Knee Flexion Extension AROM   - 2-4 x daily - 7 x weekly - 2-3 sets - 10 reps - 5 seconds hold - Straight leg raise seated in chair  - 1 x daily - 5 x weekly - 1 sets - 10 reps - 5 seconds hold - Supine Quadricep Sets  - 3 x daily - 7 x weekly - 3 sets - 10 reps - 5 second hold - Seated Knee Flexion AAROM  - 3  x daily - 7 x weekly - 1 sets - 1 reps - 3 minutes hold   ASSESSMENT:   CLINICAL IMPRESSION: Nathaniel Bailey reports compliance with and demonstrates knowledge of his HEP.  He was able to complete a full revolution on the bike today suggesting at least 105 degrees of flexion on a properly set-up bike.  AROM remains an early focus along with quadriceps strength and edema control.  OBJECTIVE IMPAIRMENTS Abnormal gait, decreased activity tolerance, decreased balance, decreased knowledge of condition, decreased knowledge of use of DME, decreased mobility, decreased ROM, decreased strength, increased edema, postural dysfunction, and  pain.    ACTIVITY LIMITATIONS community activity.    PERSONAL FACTORS 1-2 comorbidities: see PMH  are also affecting patient's functional outcome.      REHAB POTENTIAL: Good   CLINICAL DECISION MAKING: Stable/uncomplicated   EVALUATION COMPLEXITY: Low     GOALS: Goals reviewed with patient?    SHORT TERM GOALS: Target date: 11/17/2021   Patient independent and verbalizes compliance with initial HEP Baseline: SEE OBJECTIVE DATA Goal status: Met 11/03/2021   2.  Patient reports 50% improvement in right knee pain. Baseline: SEE OBJECTIVE DATA Goal status: INITIAL   3.  PROM right knee extension -3* to flexion 90* Baseline: SEE OBJECTIVE DATA Goal status: INITIAL   LONG TERM GOALS: Target date: 12/15/2021   Patient will improve FOTO score to target% Baseline: SEE OBJECTIVE DATA Goal status: INITIAL   2.  Patient reports right knee pain </= 2/10 with standing & gait activities.  Baseline: SEE OBJECTIVE DATA Goal status: INITIAL   3.  Right Knee PROM 0* extension to 100* flexion Baseline: SEE OBJECTIVE DATA Goal status: INITIAL   4.  Right Knee AROM seated -3* extension to 90* flexion Baseline: SEE OBJECTIVE DATA Goal status: INITIAL   5.  Patient ambulates >500' community distances including negotiating ramps, curbs & stairs without device  independently. Baseline: SEE OBJECTIVE DATA Goal status: INITIAL     PLAN: PT FREQUENCY 2x/wk for 1 week, then 3x/week for 2 weeks, then 2x/wk for 5 weeks   PT DURATION: 8 weeks   PLANNED INTERVENTIONS: Therapeutic exercises, Therapeutic activity, Neuromuscular re-education, Balance training, Gait training, Patient/Family education, Joint mobilization, Stair training, DME instructions, Electrical stimulation, Cryotherapy, Moist heat, scar mobilization, Taping, Vasopneumatic device, and Manual therapy   PLAN FOR NEXT SESSION: manual therapy & therapeutic exercise for knee range, end with vaso PRN    Farley Ly PT, MPT 11/03/21 2:06 PM

## 2021-11-03 NOTE — Telephone Encounter (Signed)
Please advise 

## 2021-11-08 ENCOUNTER — Ambulatory Visit: Payer: Self-pay | Attending: Physician Assistant | Admitting: Physical Therapy

## 2021-11-08 ENCOUNTER — Encounter: Payer: Self-pay | Admitting: Physical Therapy

## 2021-11-08 DIAGNOSIS — R2681 Unsteadiness on feet: Secondary | ICD-10-CM | POA: Insufficient documentation

## 2021-11-08 DIAGNOSIS — M25661 Stiffness of right knee, not elsewhere classified: Secondary | ICD-10-CM | POA: Insufficient documentation

## 2021-11-08 DIAGNOSIS — R600 Localized edema: Secondary | ICD-10-CM | POA: Insufficient documentation

## 2021-11-08 DIAGNOSIS — M25561 Pain in right knee: Secondary | ICD-10-CM | POA: Insufficient documentation

## 2021-11-08 DIAGNOSIS — R2689 Other abnormalities of gait and mobility: Secondary | ICD-10-CM | POA: Insufficient documentation

## 2021-11-08 DIAGNOSIS — M6281 Muscle weakness (generalized): Secondary | ICD-10-CM | POA: Insufficient documentation

## 2021-11-08 NOTE — Patient Instructions (Signed)
Access Code: [email or web address] ?URL: [email or web address] ?Date: 11/08/2021 ?Prepared by: Harrie Foreman ? ?Exercises ?- Quad Setting and Stretching  - 2-4 x daily - 7 x weekly - 5-10 sets - 10 reps - prop 5-10 minutes & quad set5 seconds hold ?- Supine Heel Slide with Strap  - 2-3 x daily - 7 x weekly - 2-3 sets - 10 reps - 5 seconds hold ?- Supine Straight Leg Raises  - 2-3 x daily - 7 x weekly - 2-3 sets - 10 reps - 5 seconds hold ?- Seated Knee Flexion Extension AROM   - 2-4 x daily - 7 x weekly - 2-3 sets - 10 reps - 5 seconds hold ?- Straight leg raise seated in chair  - 1 x daily - 5 x weekly - 1 sets - 10 reps - 5 seconds hold ?- Supine Quadricep Sets  - 3 x daily - 7 x weekly - 3 sets - 10 reps - 5 second hold ?- Seated Knee Flexion AAROM  - 3 x daily - 7 x weekly - 1 sets - 1 reps - 3 minutes hold ?- Prone Knee Flexion  - 1 x daily - 7 x weekly - 3 sets - 10 reps ?- Prone Hip Extension  - 1 x daily - 7 x weekly - 3 sets - 10 reps ?

## 2021-11-08 NOTE — Therapy (Signed)
[place] ?[facility] ?[address]  [address] ?[place], [place], [address] ?Phone: [phone]   Fax:  [phone] ? ?Physical Therapy Treatment ? ?Patient Details  ?Name: Nathaniel Bailey ?MRN: [id number] ?Date of Birth: 1961/05/06 ?Referring Provider (PT): Richardean Canallark, Gilbert PA-C ? ? ?Encounter Date: 11/08/2021 ? ? PT End of Session - 11/08/21 1409   ? ? Visit Number 4   ? Number of Visits 18   ? Date for PT Re-Evaluation 12/15/21   ? PT Start Time 1405   ? PT Stop Time 1448   ? PT Time Calculation (min) 43 min   ? Activity Tolerance Patient tolerated treatment well   ? Behavior During Therapy [facility] for tasks assessed/performed   ? ?  ?  ? ?  ? ? ?Past Medical History:  ?Diagnosis Date  ? Arthritis   ? Headache   ? Hypertension   ? NO MEDS , patient reports sometimes elevated   ? Pre-diabetes   ? Pt denies. A1c was 7.8 06/13/21.  ? ? ?Past Surgical History:  ?Procedure Laterality Date  ? KNEE ARTHROSCOPY Left 03/29/2020  ? Procedure: LEFT KNEE ARTHROSCOPY WITH DEBRIDEMENT AND PARTIAL MEDIAL MENISCECTOMY;  Surgeon: Kathryne HitchBlackman, Christopher Y, MD;  Location: [facility];  Service: Orthopedics;  Laterality: Left;  ? KNEE ARTHROSCOPY WITH MEDIAL MENISECTOMY Right 10/17/2017  ? Procedure: RIGHT KNEE ARTHROSCOPY WITH PARTIAL MEDIAL MENISCECTOMY AND DEBRIDEMENT;  Surgeon: Kathryne HitchBlackman, Christopher Y, MD;  Location: [facility];  Service: Orthopedics;  Laterality: Right;  ? TOTAL KNEE ARTHROPLASTY Right 09/29/2021  ? Procedure: Right TOTAL KNEE ARTHROPLASTY;  Surgeon: Kathryne HitchBlackman, Christopher Y, MD;  Location: [facility];  Service: Orthopedics;  Laterality: Right;  ? ? ?There were no vitals filed for this visit. ? ? Subjective Assessment - 11/08/21 1408   ? ? Subjective Pt. reports he doesn't have much pain unless he bends his knee.  Knee feels hot in afternoon.  He has been doing 3 exercises in evening.   ? Currently in Pain? Yes   ? Pain Score 7    ? Pain Location Knee   ? Pain  Orientation Right   ? ?  ?  ? ?  ? ? ? ? ? [facility] PT Assessment - 11/08/21 0001   ? ?  ? Assessment  ? Medical Diagnosis s/p R TKA   ? Referring Provider (PT) Richardean Canallark, Gilbert PA-C   ? Onset Date/Surgical Date 09/29/21   ? Next MD Visit 11/09/2021   ? Prior Therapy [facility] x 3 visits, [facility] other facility x 3 visits   ?  ? Precautions  ? Precautions None   ?  ? Restrictions  ? Weight Bearing Restrictions No   ?  ? ROM / Strength  ? AROM / PROM / Strength AROM   ?  ? AROM  ? Overall AROM  Deficits   ? AROM Assessment Site Knee   ? Right/Left Knee Right   ? Right Knee Flexion 104   99 pre manual therapy  ? ?  ?  ? ?  ? ? ? ? ? ? ? ? ? ? ? ? ? ? ? ? [facility] Adult PT Treatment/Exercise - 11/08/21 0001   ? ?  ? Exercises  ? Exercises Knee/Hip   ?  ? Knee/Hip Exercises: Aerobic  ? Nustep L5 x 6 min   ?  ? Knee/Hip Exercises: Seated  ? Sit to Sand 2 sets;10 reps;without UE support;Other (comment)   second set  with L foot foward to bias RLE  ?  ? Knee/Hip Exercises: Supine  ? Heel Slides Strengthening;Right;2 sets;10 reps   ? Heel Slides Limitations with strap   ? Straight Leg Raises Strengthening;Right;2 sets;10 reps   ? Straight Leg Raises Limitations with quad set   ?  ? Knee/Hip Exercises: Prone  ? Hamstring Curl 2 sets;10 reps   ? Hamstring Curl Limitations right   ? Hip Extension Strengthening;Right;10 reps   ? Contract/Relax to Increase Flexion 5 x 5 sec hold   ?  ? Modalities  ? Modalities Vasopneumatic   ?  ? Vasopneumatic  ? Number Minutes Vasopneumatic  10 minutes   ? Vasopnuematic Location  Knee   ? Vasopneumatic Pressure Low   ? Vasopneumatic Temperature  40   ?  ? Manual Therapy  ? Manual Therapy Joint mobilization;Passive ROM   ? Manual therapy comments to improve R knee ROM   ? Joint Mobilization patellar mobs all directions   ? Passive ROM in prone with hip extended, gentle rotational movements to relax glutes then prone knee bends with contract/relax to help improve tolerance.   ? ?  ?  ? ?  ? ? ? ? ? ? ? ? ? ? PT  Education - 11/08/21 1511   ? ? Education Details HEP review and update Access Code: [email or web address]   ? Person(s) Educated Patient   ? Methods Explanation;Demonstration;Verbal cues;Handout   ? Comprehension Verbalized understanding;Returned demonstration   ? ?  ?  ? ?  ? ? ? ? ? ? PT Long Term Goals - 11/08/21 1412   ? ?  ? PT LONG TERM GOAL #1  ? Title Patient reports right knee pain </= 2/10 with standing & gait activities.   ? Status On-going   ? Target Date 12/15/21   ?  ? PT LONG TERM GOAL #2  ? Title Right Knee PROM 0* extension to 100* flexion   ? Status On-going   ? Target Date 12/15/21   ?  ? PT LONG TERM GOAL #3  ? Title Right Knee AROM seated -3* extension to 90* flexion   ? Status On-going   ? Target Date 12/15/21   ?  ? PT LONG TERM GOAL #4  ? Title Patient ambulates >500' community distances including negotiating ramps, curbs & stairs without device independently   ? Status On-going   ? Target Date 12/15/21   ? ?  ?  ? ?  ? ? ? ? ? ? ? ? Plan - 11/08/21 1453   ? ? Clinical Impression Statement Nathaniel Bailey has transferred here from another location.  He reports partial compliance with HEP (only performing 1x/day), 7-8/10 knee pain, and demonstrates poor tolerance to exercise.  We reviewed and progressed HEP today adding prone hamstring/glut exercises as well.  His ROM is improving, 104 deg R knee flexion after manual therapy.  Vasopneumatic at end of session to address post session pain and edema.  He would benefit from continued skilled therapy.   ? ?  ?  ? ?  ? ? ?Patient will benefit from skilled therapeutic intervention in order to improve the following deficits and impairments:    ? ?Visit Diagnosis: ?Other abnormalities of gait and mobility ? ?Stiffness of right knee, not elsewhere classified ? ?Acute pain of right knee ? ?Localized edema ? ?Muscle weakness (generalized) ? ?Unsteadiness on feet ? ? ? ? ?Problem List ?Patient Active Problem List  ? Diagnosis Date Noted  ?  Status post total knee  replacement, right 10/01/2021  ? Status post total right knee replacement 09/29/2021  ? S/P revision of total knee, right 09/29/2021  ? Unilateral primary osteoarthritis, right knee 08/31/2020  ? Status post arthroscopy of left knee 04/05/2020  ? Other tear of medial meniscus, current injury, left knee, subsequent encounter 03/29/2020  ? Status post arthroscopy of right knee 10/28/2017  ? Medial meniscus, posterior horn derangement, right 10/17/2017  ? ? ?Jena Gauss, PT, DPT  ?11/08/2021, 5:43 PM ? ?[place] ?[facility] ?[address]  [address] ?[place], [place], [address] ?Phone: [phone]   Fax:  [phone] ? ?Name: Nathaniel Bailey ?MRN: [id number] ?Date of Birth: [birth year] ? ? ? ?

## 2021-11-09 ENCOUNTER — Ambulatory Visit (INDEPENDENT_AMBULATORY_CARE_PROVIDER_SITE_OTHER): Payer: Self-pay | Admitting: Physician Assistant

## 2021-11-09 ENCOUNTER — Encounter: Payer: Self-pay | Admitting: Physician Assistant

## 2021-11-09 DIAGNOSIS — Z96651 Presence of right artificial knee joint: Secondary | ICD-10-CM

## 2021-11-09 MED ORDER — TIZANIDINE HCL 4 MG PO TABS: 4.0000 mg | ORAL_TABLET | Freq: Four times a day (QID) | ORAL | 1 refills | Status: DC | PRN

## 2021-11-09 NOTE — Progress Notes (Addendum)
HPI: Nathaniel Bailey returns today 6 weeks status post right total knee arthroplasty.  He still having significant pain but he states it is getting better.  Otherwise doing well he is going to formal therapy.  He is ambulating with a walker.  He is still having moderate pain in the knee and requiring pain medication and muscle relaxant.  Physical exam: Right knee full extension flexion to 105 degrees.  No instability valgus varus stressing.  Surgical incisions healed well.  Calf supple nontender.  Impression: Status post right total knee arthroplasty 09/29/2021   Plan: He will continue work on range of motion strengthening of the knee.  Did refill his muscle relaxer today.  He will call when he needs refill on his pain medication.  Questions were encouraged and answered at length today using interpreter.  He is no longer on any aspirin.

## 2021-11-14 ENCOUNTER — Other Ambulatory Visit: Payer: Self-pay | Admitting: Orthopaedic Surgery

## 2021-11-14 ENCOUNTER — Ambulatory Visit: Payer: Self-pay

## 2021-11-14 ENCOUNTER — Telehealth: Payer: Self-pay | Admitting: Physician Assistant

## 2021-11-14 DIAGNOSIS — R600 Localized edema: Secondary | ICD-10-CM

## 2021-11-14 DIAGNOSIS — R2689 Other abnormalities of gait and mobility: Secondary | ICD-10-CM

## 2021-11-14 DIAGNOSIS — M6281 Muscle weakness (generalized): Secondary | ICD-10-CM

## 2021-11-14 DIAGNOSIS — M25561 Pain in right knee: Secondary | ICD-10-CM

## 2021-11-14 DIAGNOSIS — M25661 Stiffness of right knee, not elsewhere classified: Secondary | ICD-10-CM

## 2021-11-14 MED ORDER — OXYCODONE HCL 5 MG PO TABS: 5.0000 mg | ORAL_TABLET | Freq: Four times a day (QID) | ORAL | 0 refills | Status: DC | PRN

## 2021-11-14 NOTE — Therapy (Signed)
?OUTPATIENT PHYSICAL THERAPY TREATMENT NOTE ? ? ?Patient Name: Nathaniel Bailey ?MRN: [id number] ?DOB:27-Feb-1961, 61 y.o., male ?Today's Date: 11/14/2021 ? ?PCP: Pcp, No ?REFERRING PROVIDER: Pete Pelt, PA-C ? ?END OF SESSION:  ? PT End of Session - 11/14/21 1535   ? ? Visit Number 5   ? Number of Visits 18   ? Date for PT Re-Evaluation 12/15/21   ? PT Start Time 1448   ? PT Stop Time 4132   ? PT Time Calculation (min) 53 min   ? Activity Tolerance Patient tolerated treatment well   ? Behavior During Therapy [facility] for tasks assessed/performed   ? ?  ?  ? ?  ? ? ? ? ?Past Medical History:  ?Diagnosis Date  ? Arthritis   ? Headache   ? Hypertension   ? NO MEDS , patient reports sometimes elevated   ? Pre-diabetes   ? Pt denies. A1c was 7.8 06/13/21.  ? ?Past Surgical History:  ?Procedure Laterality Date  ? KNEE ARTHROSCOPY Left 03/29/2020  ? Procedure: LEFT KNEE ARTHROSCOPY WITH DEBRIDEMENT AND PARTIAL MEDIAL MENISCECTOMY;  Surgeon: Mcarthur Rossetti, MD;  Location: [place];  Service: Orthopedics;  Laterality: Left;  ? KNEE ARTHROSCOPY WITH MEDIAL MENISECTOMY Right 10/17/2017  ? Procedure: RIGHT KNEE ARTHROSCOPY WITH PARTIAL MEDIAL MENISCECTOMY AND DEBRIDEMENT;  Surgeon: Mcarthur Rossetti, MD;  Location: [facility];  Service: Orthopedics;  Laterality: Right;  ? TOTAL KNEE ARTHROPLASTY Right 09/29/2021  ? Procedure: Right TOTAL KNEE ARTHROPLASTY;  Surgeon: Mcarthur Rossetti, MD;  Location: [facility];  Service: Orthopedics;  Laterality: Right;  ? ?Patient Active Problem List  ? Diagnosis Date Noted  ? Status post total knee replacement, right 10/01/2021  ? Status post total right knee replacement 09/29/2021  ? S/P revision of total knee, right 09/29/2021  ? Unilateral primary osteoarthritis, right knee 08/31/2020  ? Status post arthroscopy of left knee 04/05/2020  ? Other tear of medial meniscus, current injury, left knee, subsequent encounter 03/29/2020  ? Status post arthroscopy of  right knee 10/28/2017  ? Medial meniscus, posterior horn derangement, right 10/17/2017  ? ? ?REFERRING DIAG: G40.102 (ICD-10-CM) - Status post total right knee replacement ? ?THERAPY DIAG:  ?Other abnormalities of gait and mobility ? ?Stiffness of right knee, not elsewhere classified ? ?Acute pain of right knee ? ?Localized edema ? ?Muscle weakness (generalized) ? ?PERTINENT HISTORY: Left meniscus tear & repair, right mensicus tear & repair, OA, HTN, pre-daibetes ? ?PRECAUTIONS: None ? ?SUBJECTIVE: Pt reports just a little pain today.  ? ?PAIN:  ?Are you having pain? Yes: NPRS scale: 6-7/10 ?Pain location: Rt knee ?Pain description: aching, sore  ?Aggravating factors: bending, standing/walking ?Relieving factors: medication ? ? ?OBJECTIVE: (objective measures completed at initial evaluation unless otherwise dated) ? ? ?PATIENT SURVEYS:  ?11/01/21 FOTO  54 (predicted 68) ?            ?EDEMA: ?RLE: above knee 43.6cm around knee 43cm below knee 36.8cm ?LLE: above knee 36.3cm around knee 36cm below knee 31.8cm ?  ?POSTURE:  ?Stands with weight shifted to left, right knee flexed with partial weight ?  ?PALPATION: ?Tenderness over right knee joint line, incision, quadriceps, hamstrings & gastroc. ?Scar mobility limited over patella and proximal tibia.  ?  ?LE ROM: ?  ?ROM ?P:passive  A:active Right ?10/24/2021 Left ?10/24/2021 Right ?11/01/21  ?Knee flexion Supine P:  ?82* ?Seated A: 73*   Supine: ?AA 97  ?Knee extension Supine P:  ?  ?A: -12*  LAQ     ? (Blank rows = not tested) ?  ?LE MMT: ?  ?MMT Right ?10/24/2021 Left ?10/24/2021  ?Knee flexion 3-/5    ?Knee extension 3-/5    ? (Blank rows = not tested) ?  ?GAIT: ?Distance walked: 100' ?Assistive device utilized: Environmental consultant - 2 wheeled ?Level of assistance: SBA ?Comments: antalgic, limited weight bearing RLE, right knee flexed in stance with minimal change in range for swing. ?  ?  ?  ?TODAY'S TREATMENT: ? ?11/14/21 ?Therapeutic Exercises: ?NuStep L5x2mn ? ?Seated R knee flexion  with foot supported on peanut ball 10x5" ? ?R gastroc/hs stretch seated with strap 2x30 sec hold (cues for proper motion) ? ?Seated R LAQ no weight 2x10 ? ?Seated R SLR x 10 ? ?Standing marches and hs curls with RW support x 10 each  ? ?Manual Therapy:  ?Passive ROM into knee flexion and extension in sitting ? ?Game Ready for 10 min post session to R knee, low compression, 40 deg ? ?11/03/21 ?Therapeutic Exercises: ?Nu Step Level 5 Seat back for extension 4 minutes and Seat up for flexion 4 minutes ? ?T[place]gate knee flexion 1 minute ? ?AAROM R knee (L knee pushes R into flexion) 10X 10 seconds ? ?Prone knee extension stretch 3 minutes ? ?Supine knee extension stretch 3 minutes ? ?Recumbent Bike Seat 7 (AAROM then full range) 8 minutes ? ?Vasopneumatic R knee 10 minutes Medium pressure 34* ? ? ?11/01/21 ?Therex: ?     Aerobic: ?NuStep L5 x 8 min ?    Sitting: ?Active knee flexion 10 x 5 sec hold; Rt ?SLR with min cues for quad activation x10 reps; Rt ?Long sitting quad set 10 x 5 sec hold; Rt ?    Supine: ?Rt SLR x 10 reps ?Rt AA heelslides 10x5 sec hold; Rt ? ? ?10/24/21: PT instructed in HEP with pt performing 1 set of each for comprehension.  ?  ?  ?PATIENT EDUCATION:  ? Access Code: [email or web address]?URL: [email or web address] ?Date: 11/03/2021 ?Prepared by: RVista Mink? ?Exercises ?- Quad Setting and Stretching  - 2-4 x daily - 7 x weekly - 5-10 sets - 10 reps - prop 5-10 minutes & quad set5 seconds hold ?- Supine Heel Slide with Strap  - 2-3 x daily - 7 x weekly - 2-3 sets - 10 reps - 5 seconds hold ?- Supine Straight Leg Raises  - 2-3 x daily - 7 x weekly - 2-3 sets - 10 reps - 5 seconds hold ?- Seated Knee Flexion Extension AROM   - 2-4 x daily - 7 x weekly - 2-3 sets - 10 reps - 5 seconds hold ?- Straight leg raise seated in chair  - 1 x daily - 5 x weekly - 1 sets - 10 reps - 5 seconds hold ?- Supine Quadricep Sets  - 3 x daily - 7 x weekly - 3 sets - 10 reps - 5 second hold ?- Seated Knee Flexion  AAROM  - 3 x daily - 7 x weekly - 1 sets - 1 reps - 3 minutes hold ?  ?ASSESSMENT: ?  ?CLINICAL IMPRESSION: ? ?Pt demonstrated a good capacity for TE today. He did note some quad soreness and fatigue after LAQ. Instructions given throughout session for proper movement with exercises for understanding and max benefit. Initiated standing TE today as well and he showed a good response. Ended session with GR to decrease pain and control swelling post exercise. ? ?OBJECTIVE IMPAIRMENTS Abnormal gait, decreased activity tolerance, decreased  balance, decreased knowledge of condition, decreased knowledge of use of DME, decreased mobility, decreased ROM, decreased strength, increased edema, postural dysfunction, and pain.  ?  ?ACTIVITY LIMITATIONS community activity.  ?  ?PERSONAL FACTORS 1-2 comorbidities: see PMH  are also affecting patient's functional outcome.  ?  ?  ?REHAB POTENTIAL: Good ?  ?CLINICAL DECISION MAKING: Stable/uncomplicated ?  ?EVALUATION COMPLEXITY: Low ?  ?  ?GOALS: ?Goals reviewed with patient?  ?  ?SHORT TERM GOALS: Target date: 11/17/2021 ?  ?Patient independent and verbalizes compliance with initial HEP ?Baseline: SEE OBJECTIVE DATA ?Goal status: Met 11/03/2021 ?  ?2.  Patient reports 50% improvement in right knee pain. ?Baseline: SEE OBJECTIVE DATA ?Goal status: INITIAL ?  ?3.  PROM right knee extension -3* to flexion 90* ?Baseline: SEE OBJECTIVE DATA ?Goal status: INITIAL ?  ?LONG TERM GOALS: Target date: 12/15/2021 ?  ?Patient will improve FOTO score to target% ?Baseline: SEE OBJECTIVE DATA ?Goal status: INITIAL ?  ?2.  Patient reports right knee pain </= 2/10 with standing & gait activities.  ?Baseline: SEE OBJECTIVE DATA ?Goal status: INITIAL ?  ?3.  Right Knee PROM 0* extension to 100* flexion ?Baseline: SEE OBJECTIVE DATA ?Goal status: INITIAL ?  ?4.  Right Knee AROM seated -3* extension to 90* flexion ?Baseline: SEE OBJECTIVE DATA ?Goal status: INITIAL ?  ?5.  Patient ambulates >500' community  distances including negotiating ramps, curbs & stairs without device independently. ?Baseline: SEE OBJECTIVE DATA ?Goal status: INITIAL ?  ?  ?PLAN: ?PT FREQUENCY 2x/wk for 1 week, then 3x/week for 2 weeks

## 2021-11-14 NOTE — Telephone Encounter (Signed)
Please advise 

## 2021-11-14 NOTE — Telephone Encounter (Signed)
Patient called. He would like a refill on his pain medication. His call back number is [phone] 

## 2021-11-16 ENCOUNTER — Ambulatory Visit: Payer: Self-pay | Admitting: Physical Therapy

## 2021-11-16 ENCOUNTER — Encounter: Payer: Self-pay | Admitting: Physical Therapy

## 2021-11-16 DIAGNOSIS — M6281 Muscle weakness (generalized): Secondary | ICD-10-CM

## 2021-11-16 DIAGNOSIS — R2689 Other abnormalities of gait and mobility: Secondary | ICD-10-CM

## 2021-11-16 DIAGNOSIS — R600 Localized edema: Secondary | ICD-10-CM

## 2021-11-16 DIAGNOSIS — M25561 Pain in right knee: Secondary | ICD-10-CM

## 2021-11-16 DIAGNOSIS — M25661 Stiffness of right knee, not elsewhere classified: Secondary | ICD-10-CM

## 2021-11-16 DIAGNOSIS — R2681 Unsteadiness on feet: Secondary | ICD-10-CM

## 2021-11-16 NOTE — Therapy (Signed)
?OUTPATIENT PHYSICAL THERAPY TREATMENT NOTE ? ? ?Patient Name: Nathaniel Bailey ?MRN: [id number] ?DOB:Feb 14, 1961, 61 y.o., male ?Today's Date: 11/16/2021 ? ?PCP: Pcp, No ?REFERRING PROVIDER: Pete Pelt, PA-C ? ? ? PT End of Session - 11/16/21 1408   ? ? Visit Number 6   ? Number of Visits 18   ? Date for PT Re-Evaluation 12/15/21   ? PT Start Time 1405   ? PT Stop Time 1500   ? PT Time Calculation (min) 55 min   ? Activity Tolerance Patient tolerated treatment well   ? Behavior During Therapy [facility] for tasks assessed/performed   ? ?  ?  ? ?  ? ? ? ? ?Past Medical History:  ?Diagnosis Date  ? Arthritis   ? Headache   ? Hypertension   ? NO MEDS , patient reports sometimes elevated   ? Pre-diabetes   ? Pt denies. A1c was 7.8 06/13/21.  ? ?Past Surgical History:  ?Procedure Laterality Date  ? KNEE ARTHROSCOPY Left 03/29/2020  ? Procedure: LEFT KNEE ARTHROSCOPY WITH DEBRIDEMENT AND PARTIAL MEDIAL MENISCECTOMY;  Surgeon: Mcarthur Rossetti, MD;  Location: [place];  Service: Orthopedics;  Laterality: Left;  ? KNEE ARTHROSCOPY WITH MEDIAL MENISECTOMY Right 10/17/2017  ? Procedure: RIGHT KNEE ARTHROSCOPY WITH PARTIAL MEDIAL MENISCECTOMY AND DEBRIDEMENT;  Surgeon: Mcarthur Rossetti, MD;  Location: [facility];  Service: Orthopedics;  Laterality: Right;  ? TOTAL KNEE ARTHROPLASTY Right 09/29/2021  ? Procedure: Right TOTAL KNEE ARTHROPLASTY;  Surgeon: Mcarthur Rossetti, MD;  Location: [facility];  Service: Orthopedics;  Laterality: Right;  ? ?Patient Active Problem List  ? Diagnosis Date Noted  ? Status post total knee replacement, right 10/01/2021  ? Status post total right knee replacement 09/29/2021  ? S/P revision of total knee, right 09/29/2021  ? Unilateral primary osteoarthritis, right knee 08/31/2020  ? Status post arthroscopy of left knee 04/05/2020  ? Other tear of medial meniscus, current injury, left knee, subsequent encounter 03/29/2020  ? Status post arthroscopy of right knee  10/28/2017  ? Medial meniscus, posterior horn derangement, right 10/17/2017  ? ? ?REFERRING DIAG: T41.962 (ICD-10-CM) - Status post total right knee replacement ? ?THERAPY DIAG:  ?Other abnormalities of gait and mobility ? ?Stiffness of right knee, not elsewhere classified ? ?Acute pain of right knee ? ?Localized edema ? ?Muscle weakness (generalized) ? ?Unsteadiness on feet ? ?PERTINENT HISTORY: Left meniscus tear & repair, right mensicus tear & repair, OA, HTN, pre-daibetes ? ?PRECAUTIONS: None ? ?SUBJECTIVE: Pt is doing well, doing HEP 4x/day now.   ? ?PAIN:  ?Are you having pain? Yes: NPRS scale: 6/10 ?Pain location: Rt knee ?Pain description: aching, sore  ?Aggravating factors: bending, standing/walking ?Relieving factors: medication ? ? ?OBJECTIVE: (objective measures completed at initial evaluation unless otherwise dated) ? ? ?PATIENT SURVEYS:  ?11/01/21 FOTO  54 (predicted 68) ?            ?EDEMA: ?RLE: above knee 43.6cm around knee 43cm below knee 36.8cm ?LLE: above knee 36.3cm around knee 36cm below knee 31.8cm ?  ?POSTURE:  ?Stands with weight shifted to left, right knee flexed with partial weight ?  ?PALPATION: ?Tenderness over right knee joint line, incision, quadriceps, hamstrings & gastroc. ?Scar mobility limited over patella and proximal tibia.  ?  ?LE ROM: ?  ?ROM ?P:passive  A:active Right ?10/24/2021 Left ?10/24/2021 Right ?11/01/21 Right ?5/11  ?Knee flexion Supine P:  ?82* ?Seated A: 73*   Supine: ?AA 97 Supine: ?AA 105  ?Knee  extension Supine P:  ?  ?A: -12* LAQ      ? (Blank rows = not tested) ?  ?LE MMT: ?  ?MMT Right ?10/24/2021 Left ?10/24/2021  ?Knee flexion 3-/5    ?Knee extension 3-/5    ? (Blank rows = not tested) ?  ?GAIT: ?Distance walked: 100' ?Assistive device utilized: Environmental consultant - 2 wheeled ?Level of assistance: SBA ?Comments: antalgic, limited weight bearing RLE, right knee flexed in stance with minimal change in range for swing. ?  ?  ?  ?TODAY'S TREATMENT: ? ?11/16/2021 ?Therapeutic  Exercise: to improve strength and mobility.  Verbal and tactile cues throughout for technique. ?Bike x 6 min - starting with partial revolutions but able to progress to full revolution,  seat position 5 ?Step ups ( 1 riser) 2 x 10 bil, side step up (1 riser) x 10 bil, step down (no riser) x 10 bil.   ?At counter with UE support for safety - heel raises x 20, hip extension x 10 bil, hip abduction x 20 bil, squats x 20.  ? ?Manual Therapy to R knee to improve ROM - patellar mobs, mobs of tibia on femur, PROM flexion.  ? ?Vasopneumatic: Gameready at end of session for post session soreness/edema.  ?10 min, low compression, 34 deg (coldest).  ? ? ? ?11/14/21 ?Therapeutic Exercises: ?NuStep L5x35mn ? ?Seated R knee flexion with foot supported on peanut ball 10x5" ? ?R gastroc/hs stretch seated with strap 2x30 sec hold (cues for proper motion) ? ?Seated R LAQ no weight 2x10 ? ?Seated R SLR x 10 ? ?Standing marches and hs curls with RW support x 10 each  ? ?Manual Therapy:  ?Passive ROM into knee flexion and extension in sitting ? ?Game Ready for 10 min post session to R knee, low compression, 40 deg ? ? ?  ?PATIENT EDUCATION:  ?  Access Code: [email or web address] ? ?Exercises Added  ?- Standing Knee Flexion Stretch on Step  - 3 x daily - 7 x weekly - 1 sets - 3 reps - 30 sec  hold ?- Heel Raises with Counter Support  - 1 x daily - 7 x weekly - 2-3 sets - 10 reps ?- Standing Hip Extension with Counter Support  - 1 x daily - 7 x weekly - 2-3 sets - 10 reps ?- Standing Hip Abduction with Counter Support  - 1 x daily - 7 x weekly - 2-3 sets - 10 reps ?- Mini Squat with Counter Support  - 1 x daily - 7 x weekly - 2-3 sets - 10 reps ? ?ASSESSMENT: ?  ?CLINICAL IMPRESSION: ? ?Nathaniel Bailey making good progress towards goals, arrived today using SPC and able to walk around clinic without AD.  He tolerated progression of exercises today with standing exercises including steps.  He did express some discomfort at time, but  reported that is non-operative leg was more painful than RLE at times.  Updated HEP.  Flexion was 105 after interventions today.  Ended session with GR to decrease pain and control swelling post exercise. ? ?OBJECTIVE IMPAIRMENTS Abnormal gait, decreased activity tolerance, decreased balance, decreased knowledge of condition, decreased knowledge of use of DME, decreased mobility, decreased ROM, decreased strength, increased edema, postural dysfunction, and pain.  ?  ?ACTIVITY LIMITATIONS community activity.  ?  ?PERSONAL FACTORS 1-2 comorbidities: see PMH  are also affecting patient's functional outcome.  ?  ?  ?REHAB POTENTIAL: Good ?  ?CLINICAL DECISION MAKING: Stable/uncomplicated ?  ?EVALUATION COMPLEXITY:  Low ?  ?  ?GOALS: ?Goals reviewed with patient?  ?  ?SHORT TERM GOALS: Target date: 11/17/2021 ?  ?Patient independent and verbalizes compliance with initial HEP ?Baseline: SEE OBJECTIVE DATA ?Goal status: Met 11/03/2021 ?  ?2.  Patient reports 50% improvement in right knee pain. ?Baseline: SEE OBJECTIVE DATA ?Goal status: INITIAL ?  ?3.  PROM right knee extension -3* to flexion 90* ?Baseline: SEE OBJECTIVE DATA ?Goal status: INITIAL ?  ?LONG TERM GOALS: Target date: 12/15/2021 ?  ?Patient will improve FOTO score to target% ?Baseline: SEE OBJECTIVE DATA ?Goal status: INITIAL ?  ?2.  Patient reports right knee pain </= 2/10 with standing & gait activities.  ?Baseline: SEE OBJECTIVE DATA ?Goal status: INITIAL ?  ?3.  Right Knee PROM 0* extension to 100* flexion ?Baseline: SEE OBJECTIVE DATA ?Goal status: INITIAL ?  ?4.  Right Knee AROM seated -3* extension to 90* flexion ?Baseline: SEE OBJECTIVE DATA ?Goal status: INITIAL ?  ?5.  Patient ambulates >500' community distances including negotiating ramps, curbs & stairs without device independently. ?Baseline: SEE OBJECTIVE DATA ?Goal status: INITIAL ?  ?  ?PLAN: ?PT FREQUENCY 2x/wk for 1 week, then 3x/week for 2 weeks, then 2x/wk for 5 weeks ?  ?PT DURATION: 8 weeks ?   ?PLANNED INTERVENTIONS: Therapeutic exercises, Therapeutic activity, Neuromuscular re-education, Balance training, Gait training, Patient/Family education, Joint mobilization, Stair training, DME instructions, Elec

## 2021-11-21 ENCOUNTER — Ambulatory Visit: Payer: Self-pay

## 2021-11-21 DIAGNOSIS — M6281 Muscle weakness (generalized): Secondary | ICD-10-CM

## 2021-11-21 DIAGNOSIS — M25661 Stiffness of right knee, not elsewhere classified: Secondary | ICD-10-CM

## 2021-11-21 DIAGNOSIS — R2689 Other abnormalities of gait and mobility: Secondary | ICD-10-CM

## 2021-11-21 DIAGNOSIS — R2681 Unsteadiness on feet: Secondary | ICD-10-CM

## 2021-11-21 DIAGNOSIS — R600 Localized edema: Secondary | ICD-10-CM

## 2021-11-21 DIAGNOSIS — M25561 Pain in right knee: Secondary | ICD-10-CM

## 2021-11-21 NOTE — Therapy (Signed)
?OUTPATIENT PHYSICAL THERAPY TREATMENT NOTE ? ? ?Patient Name: Nathaniel Bailey ?MRN: [id number] ?DOB:[birth year], 61 y.o., male ?Today's Date: 11/21/2021 ? ?PCP: Pcp, No ?REFERRING PROVIDER: Pete Pelt, PA-C ? ? ? PT End of Session - 11/21/21 1446   ? ? Visit Number 7   ? Number of Visits 18   ? Date for PT Re-Evaluation 12/15/21   ? PT Start Time 1409   pt late  ? PT Stop Time 1456   ? PT Time Calculation (min) 47 min   ? Activity Tolerance Patient tolerated treatment well   ? Behavior During Therapy [facility] for tasks assessed/performed   ? ?  ?  ? ?  ? ? ? ? ? ?Past Medical History:  ?Diagnosis Date  ? Arthritis   ? Headache   ? Hypertension   ? NO MEDS , patient reports sometimes elevated   ? Pre-diabetes   ? Pt denies. A1c was 7.8 06/13/21.  ? ?Past Surgical History:  ?Procedure Laterality Date  ? KNEE ARTHROSCOPY Left 03/29/2020  ? Procedure: LEFT KNEE ARTHROSCOPY WITH DEBRIDEMENT AND PARTIAL MEDIAL MENISCECTOMY;  Surgeon: Mcarthur Rossetti, MD;  Location: [place];  Service: Orthopedics;  Laterality: Left;  ? KNEE ARTHROSCOPY WITH MEDIAL MENISECTOMY Right 10/17/2017  ? Procedure: RIGHT KNEE ARTHROSCOPY WITH PARTIAL MEDIAL MENISCECTOMY AND DEBRIDEMENT;  Surgeon: Mcarthur Rossetti, MD;  Location: [facility];  Service: Orthopedics;  Laterality: Right;  ? TOTAL KNEE ARTHROPLASTY Right 09/29/2021  ? Procedure: Right TOTAL KNEE ARTHROPLASTY;  Surgeon: Mcarthur Rossetti, MD;  Location: [facility];  Service: Orthopedics;  Laterality: Right;  ? ?Patient Active Problem List  ? Diagnosis Date Noted  ? Status post total knee replacement, right 10/01/2021  ? Status post total right knee replacement 09/29/2021  ? S/P revision of total knee, right 09/29/2021  ? Unilateral primary osteoarthritis, right knee 08/31/2020  ? Status post arthroscopy of left knee 04/05/2020  ? Other tear of medial meniscus, current injury, left knee, subsequent encounter 03/29/2020  ? Status post arthroscopy of  right knee 10/28/2017  ? Medial meniscus, posterior horn derangement, right 10/17/2017  ? ? ?REFERRING DIAG: X54.008 (ICD-10-CM) - Status post total right knee replacement ? ?THERAPY DIAG:  ?Other abnormalities of gait and mobility ? ?Stiffness of right knee, not elsewhere classified ? ?Acute pain of right knee ? ?Localized edema ? ?Muscle weakness (generalized) ? ?Unsteadiness on feet ? ?PERTINENT HISTORY: Left meniscus tear & repair, right mensicus tear & repair, OA, HTN, pre-daibetes ? ?PRECAUTIONS: None ? ?SUBJECTIVE: Pt reports he is doing good, feels 50% confident with cane. ? ?PAIN:  ?Are you having pain? No: NPRS scale: 0/10 ?Pain location: N/A ?Pain description: N/A ?Aggravating factors: bending, standing/walking ?Relieving factors: medication ? ? ?OBJECTIVE: (objective measures completed at initial evaluation unless otherwise dated) ? ? ?PATIENT SURVEYS:  ?11/01/21 FOTO  54 (predicted 68) ?            ?EDEMA: ?RLE: above knee 43.6cm around knee 43cm below knee 36.8cm ?LLE: above knee 36.3cm around knee 36cm below knee 31.8cm ?  ?POSTURE:  ?Stands with weight shifted to left, right knee flexed with partial weight ?  ?PALPATION: ?Tenderness over right knee joint line, incision, quadriceps, hamstrings & gastroc. ?Scar mobility limited over patella and proximal tibia.  ?  ?LE ROM: ?  ?ROM ?P:passive  A:active Right ?10/24/2021 Left ?10/24/2021 Right ?11/01/21 Right ?5/11  ?Knee flexion Supine P:  ?82* ?Seated A: 73*   Supine: ?AA 97 Supine: ?AA 105  ?  Knee extension Supine P:  ?  ?A: -12* LAQ      ? (Blank rows = not tested) ?  ?LE MMT: ?  ?MMT Right ?10/24/2021 Left ?10/24/2021  ?Knee flexion 3-/5    ?Knee extension 3-/5    ? (Blank rows = not tested) ?  ?GAIT: ?Distance walked: 100' ?Assistive device utilized: Environmental consultant - 2 wheeled ?Level of assistance: SBA ?Comments: antalgic, limited weight bearing RLE, right knee flexed in stance with minimal change in range for swing. ?  ?  ?  ?TODAY'S  TREATMENT: ?11/21/21 ?Therapeutic Exercise ?Recumbent Bike L1x38mn ?R runners stretch 2x30" ?R step up x 10 on 6' step;  ?R step up and over for ecc quad strengthening 10x with 4' step ?Hip abduction at counter with 2# weight 15 x ? ?Gait Training: ?180 ft with SPC - decreased TKE, good heel-toe pattern and hip/knee flexion ? ?Vasopneumatic: Gameready at end of session for post session soreness/edema.  ?10 min, low compression, 34 deg (coldest).  ?11/16/2021 ?Therapeutic Exercise: to improve strength and mobility.  Verbal and tactile cues throughout for technique. ?Bike x 6 min - starting with partial revolutions but able to progress to full revolution,  seat position 5 ?Step ups ( 1 riser) 2 x 10 bil, side step up (1 riser) x 10 bil, step down (no riser) x 10 bil.   ?At counter with UE support for safety - heel raises x 20, hip extension x 10 bil, hip abduction x 20 bil, squats x 20.  ? ?Manual Therapy to R knee to improve ROM - patellar mobs, mobs of tibia on femur, PROM flexion.  ? ?Vasopneumatic: Gameready at end of session for post session soreness/edema.  ?10 min, low compression, 34 deg (coldest).  ? ? ? ?11/14/21 ?Therapeutic Exercises: ?NuStep L5x655m ? ?Seated R knee flexion with foot supported on peanut ball 10x5" ? ?R gastroc/hs stretch seated with strap 2x30 sec hold (cues for proper motion) ? ?Seated R LAQ no weight 2x10 ? ?Seated R SLR x 10 ? ?Standing marches and hs curls with RW support x 10 each  ? ?Manual Therapy:  ?Passive ROM into knee flexion and extension in sitting ? ?Game Ready for 10 min post session to R knee, low compression, 40 deg ? ? ?  ?PATIENT EDUCATION:  ?  Access Code: [email or web address] ? ?Exercises Added  ?- Standing Knee Flexion Stretch on Step  - 3 x daily - 7 x weekly - 1 sets - 3 reps - 30 sec  hold ?- Heel Raises with Counter Support  - 1 x daily - 7 x weekly - 2-3 sets - 10 reps ?- Standing Hip Extension with Counter Support  - 1 x daily - 7 x weekly - 2-3 sets - 10 reps ?- Standing  Hip Abduction with Counter Support  - 1 x daily - 7 x weekly - 2-3 sets - 10 reps ?- Mini Squat with Counter Support  - 1 x daily - 7 x weekly - 2-3 sets - 10 reps ? ?ASSESSMENT: ?  ?CLINICAL IMPRESSION: ?Pt shows a good response to the progression of TE this session. He is now using a cane for ambulation. He did have trouble with eccentric lowering on step with R LE. He has met STG #2, see goals for specifics. Ended session with GR to address swelling and soreness post exercise. ? ?OBJECTIVE IMPAIRMENTS Abnormal gait, decreased activity tolerance, decreased balance, decreased knowledge of condition, decreased knowledge of use of DME, decreased mobility, decreased  ROM, decreased strength, increased edema, postural dysfunction, and pain.  ?  ?ACTIVITY LIMITATIONS community activity.  ?  ?PERSONAL FACTORS 1-2 comorbidities: see PMH  are also affecting patient's functional outcome.  ?  ?  ?REHAB POTENTIAL: Good ?  ?CLINICAL DECISION MAKING: Stable/uncomplicated ?  ?EVALUATION COMPLEXITY: Low ?  ?  ?GOALS: ?Goals reviewed with patient?  ?  ?SHORT TERM GOALS: Target date: 11/17/2021 ?  ?Patient independent and verbalizes compliance with initial HEP ?Baseline: SEE OBJECTIVE DATA ?Goal status: Met 11/03/2021 ?  ?2.  Patient reports 50% improvement in right knee pain. ?Baseline: SEE OBJECTIVE DATA ?Goal status: Met (11/21/21 60% improvement) ?  ?3.  PROM right knee extension -3* to flexion 90* ?Baseline: SEE OBJECTIVE DATA ?Goal status: INITIAL ?  ?LONG TERM GOALS: Target date: 12/15/2021 ?  ?Patient will improve FOTO score to target% ?Baseline: SEE OBJECTIVE DATA ?Goal status: INITIAL ?  ?2.  Patient reports right knee pain </= 2/10 with standing & gait activities.  ?Baseline: SEE OBJECTIVE DATA ?Goal status: INITIAL ?  ?3.  Right Knee PROM 0* extension to 100* flexion ?Baseline: SEE OBJECTIVE DATA ?Goal status: INITIAL ?  ?4.  Right Knee AROM seated -3* extension to 90* flexion ?Baseline: SEE OBJECTIVE DATA ?Goal status:  INITIAL ?  ?5.  Patient ambulates >500' community distances including negotiating ramps, curbs & stairs without device independently. ?Baseline: SEE OBJECTIVE DATA ?Goal status: INITIAL ?  ?  ?PLAN: ?PT FREQUENCY 2x/wk for 1 week, then 3x/

## 2021-11-23 ENCOUNTER — Other Ambulatory Visit: Payer: Self-pay | Admitting: Orthopaedic Surgery

## 2021-11-23 ENCOUNTER — Telehealth: Payer: Self-pay | Admitting: Orthopaedic Surgery

## 2021-11-23 ENCOUNTER — Ambulatory Visit: Payer: Self-pay | Admitting: Physical Therapy

## 2021-11-23 ENCOUNTER — Encounter: Payer: Self-pay | Admitting: Physical Therapy

## 2021-11-23 DIAGNOSIS — R2681 Unsteadiness on feet: Secondary | ICD-10-CM

## 2021-11-23 DIAGNOSIS — M25561 Pain in right knee: Secondary | ICD-10-CM

## 2021-11-23 DIAGNOSIS — R2689 Other abnormalities of gait and mobility: Secondary | ICD-10-CM

## 2021-11-23 DIAGNOSIS — M25661 Stiffness of right knee, not elsewhere classified: Secondary | ICD-10-CM

## 2021-11-23 DIAGNOSIS — M6281 Muscle weakness (generalized): Secondary | ICD-10-CM

## 2021-11-23 DIAGNOSIS — R600 Localized edema: Secondary | ICD-10-CM

## 2021-11-23 MED ORDER — OXYCODONE HCL 5 MG PO TABS: 5.0000 mg | ORAL_TABLET | Freq: Three times a day (TID) | ORAL | 0 refills | Status: DC | PRN

## 2021-11-23 NOTE — Telephone Encounter (Signed)
Patient called. He would like a refill on his pain medication. His call back number is [phone] 

## 2021-11-23 NOTE — Therapy (Signed)
OUTPATIENT PHYSICAL THERAPY TREATMENT NOTE   Patient Name: Nathaniel Bailey MRN: [id number] DOB:[birth year], 61 y.o., male Today's Date: 11/23/2021  PCP: Nathaniel Bailey, No REFERRING PROVIDER: Pete Pelt, PA-C    PT End of Session - 11/23/21 1358     Visit Number 8    Number of Visits 18    Date for PT Re-Evaluation 12/15/21    PT Start Time 1400    PT Stop Time 1450    PT Time Calculation (min) 50 min    Activity Tolerance Patient tolerated treatment well    Behavior During Therapy WFL for tasks assessed/performed                Past Medical History:  Diagnosis Date   Arthritis    Headache    Hypertension    NO MEDS , patient reports sometimes elevated    Pre-diabetes    Pt denies. A1c was 7.8 06/13/21.   Past Surgical History:  Procedure Laterality Date   KNEE ARTHROSCOPY Left 03/29/2020   Procedure: LEFT KNEE ARTHROSCOPY WITH DEBRIDEMENT AND PARTIAL MEDIAL MENISCECTOMY;  Surgeon: Nathaniel Rossetti, MD;  Location: [place];  Service: Orthopedics;  Laterality: Left;   KNEE ARTHROSCOPY WITH MEDIAL MENISECTOMY Right 10/17/2017   Procedure: RIGHT KNEE ARTHROSCOPY WITH PARTIAL MEDIAL MENISCECTOMY AND DEBRIDEMENT;  Surgeon: Nathaniel Rossetti, MD;  Location: [facility];  Service: Orthopedics;  Laterality: Right;   TOTAL KNEE ARTHROPLASTY Right 09/29/2021   Procedure: Right TOTAL KNEE ARTHROPLASTY;  Surgeon: Nathaniel Rossetti, MD;  Location: [facility];  Service: Orthopedics;  Laterality: Right;   Patient Active Problem List   Diagnosis Date Noted   Status post total knee replacement, right 10/01/2021   Status post total right knee replacement 09/29/2021   S/P revision of total knee, right 09/29/2021   Unilateral primary osteoarthritis, right knee 08/31/2020   Status post arthroscopy of left knee 04/05/2020   Other tear of medial meniscus, current injury, left knee, subsequent encounter 03/29/2020   Status post arthroscopy of right knee  10/28/2017   Medial meniscus, posterior horn derangement, right 10/17/2017    REFERRING DIAG: Z96.651 (ICD-10-CM) - Status post total right knee replacement  THERAPY DIAG:  Other abnormalities of gait and mobility  Stiffness of right knee, not elsewhere classified  Acute pain of right knee  Localized edema  Muscle weakness (generalized)  Unsteadiness on feet  PERTINENT HISTORY: Left meniscus tear & repair, right mensicus tear & repair, OA, HTN, pre-daibetes  PRECAUTIONS: None  SUBJECTIVE: Pt. Reports increased soreness in R knee today due to weather.   PAIN:  Are you having pain? Yes NPRS scale: 6/10 Pain location: N/A Pain description: N/A Aggravating factors: bending, standing/walking Relieving factors: medication   OBJECTIVE: (objective measures completed at initial evaluation unless otherwise dated)   PATIENT SURVEYS:  11/01/21 FOTO  43 (predicted 59)             EDEMA: RLE: above knee 43.6cm around knee 43cm below knee 36.8cm LLE: above knee 36.3cm around knee 36cm below knee 31.8cm   POSTURE:  Stands with weight shifted to left, right knee flexed with partial weight   PALPATION: Tenderness over right knee joint line, incision, quadriceps, hamstrings & gastroc. Scar mobility limited over patella and proximal tibia.    LE ROM:   ROM P:passive  A:active Right 10/24/2021 Right 11/01/21 Right 5/11 Right 5/18  Knee flexion Supine P:  82* Seated A: 73* Supine: AA 97 Supine: AA 105 Standing AROM 110  Knee extension Supine P:    A: -12* LAQ   AROM -3   (Blank rows = not tested)   LE MMT:   MMT Right 10/24/2021 Right 11/23/21  Knee flexion 3-/5  5/5  Knee extension 3-/5  5/5   (Blank rows = not tested)   GAIT: Distance walked: 100' Assistive device utilized: Environmental consultant - 2 wheeled Level of assistance: SBA Comments: antalgic, limited weight bearing RLE, right knee flexed in stance with minimal change in range for swing.       TODAY'S  TREATMENT: 11/23/2021  Therapeutic Exercise: to improve strength and mobility.  Demo, verbal and tactile cues throughout for technique. Recumbent Bike L1x4mn R knee AROM on step x 10 followed by runners stretch 2x30" Step down x 10 bil 2" step, 1 UE support  Side step down 2 x 10 bil 2" step  Hamstring curls 2# ankle weight at  counter x 15 R Hip abduction and extension at counter with 2# weight x 15 R Gait Training: 950 ft without device - good heel-toe pattern and hip/knee flexion, no evidence of instability or buckling, reported no increase in pain.  Vasopneumatic: Gameready at end of session for post session soreness/edema.  10 min, low compression, 34 deg (coldest).   11/21/21 Therapeutic Exercise Recumbent Bike L1x55m R runners stretch 2x30" R step up x 10 on 6' step;  R step up and over for ecc quad strengthening 10x with 4' step Hip abduction at counter with 2# weight 15 x  Gait Training: 180 ft with SPC - decreased TKE, good heel-toe pattern and hip/knee flexion  Vasopneumatic: Gameready at end of session for post session soreness/edema.  10 min, low compression, 34 deg (coldest).   11/16/2021 Therapeutic Exercise: to improve strength and mobility.  Verbal and tactile cues throughout for technique. Bike x 6 min - starting with partial revolutions but able to progress to full revolution,  seat position 5 Step ups ( 1 riser) 2 x 10 bil, side step up (1 riser) x 10 bil, step down (no riser) x 10 bil.   At counter with UE support for safety - heel raises x 20, hip extension x 10 bil, hip abduction x 20 bil, squats x 20.   Manual Therapy to R knee to improve ROM - patellar mobs, mobs of tibia on femur, PROM flexion.   Vasopneumatic: Gameready at end of session for post session soreness/edema.  10 min, low compression, 34 deg (coldest).       PATIENT EDUCATION:    Access Code: [email or web address] ASSESSMENT:   CLINICAL IMPRESSION:  Nathaniel Bailey making good  progress towards all goals, demonstrating 3 to 110 deg R knee AROM and improved gait mechanics, able to ambulate 950' without device today, with good gait mechanics.  He does report more knee pain today, and also increased pain with stepping down.  Ended session with GR to address swelling and soreness post exercise.  He would benefit from continued skilled therapy.   OBJECTIVE IMPAIRMENTS Abnormal gait, decreased activity tolerance, decreased balance, decreased knowledge of condition, decreased knowledge of use of DME, decreased mobility, decreased ROM, decreased strength, increased edema, postural dysfunction, and pain.    ACTIVITY LIMITATIONS community activity.    PERSONAL FACTORS 1-2 comorbidities: see PMH  are also affecting patient's functional outcome.      REHAB POTENTIAL: Good   CLINICAL DECISION MAKING: Stable/uncomplicated   EVALUATION COMPLEXITY: Low     GOALS: Goals reviewed with patient?  SHORT TERM GOALS: Target date: 11/17/2021   Patient independent and verbalizes compliance with initial HEP Baseline: SEE OBJECTIVE DATA Goal status: Met 11/03/2021   2.  Patient reports 50% improvement in right knee pain. Baseline: SEE OBJECTIVE DATA Goal status: Met (11/21/21 60% improvement)   3.  PROM right knee extension -3* to flexion 90* Baseline: SEE OBJECTIVE DATA Goal status: MET 3 to 110   LONG TERM GOALS: Target date: 12/15/2021   Patient will improve FOTO score to target% Baseline: SEE OBJECTIVE DATA Goal status: Ongoing   2.  Patient reports right knee pain </= 2/10 with standing & gait activities.  Baseline: SEE OBJECTIVE DATA Goal status: Ongoing   3.  Right Knee PROM 0* extension to 100* flexion Baseline: SEE OBJECTIVE DATA Goal status: Ongoing 11/23/21 R knee AROM 3 to 110   4.  Right Knee AROM seated -3* extension to 90* flexion Baseline: SEE OBJECTIVE DATA Goal status: MET R knee 3 to 105 seated   5.  Patient ambulates >500' community distances  including negotiating ramps, curbs & stairs without device independently. Baseline: SEE OBJECTIVE DATA Goal status: Ongoing     PLAN: PT FREQUENCY 2x/wk for 1 week, then 3x/week for 2 weeks, then 2x/wk for 5 weeks   PT DURATION: 8 weeks   PLANNED INTERVENTIONS: Therapeutic exercises, Therapeutic activity, Neuromuscular re-education, Balance training, Gait training, Patient/Family education, Joint mobilization, Stair training, DME instructions, Electrical stimulation, Cryotherapy, Moist heat, scar mobilization, Taping, Vasopneumatic device, and Manual therapy   PLAN FOR NEXT SESSION: Step ups, standing TE, knee ROM, manual therapy & therapeutic exercise for knee range, end with vaso PRN    Rennie Natter, PT, DPT 11/23/21 2:42 PM

## 2021-11-28 ENCOUNTER — Ambulatory Visit: Payer: Self-pay

## 2021-11-28 DIAGNOSIS — M25561 Pain in right knee: Secondary | ICD-10-CM

## 2021-11-28 DIAGNOSIS — R2689 Other abnormalities of gait and mobility: Secondary | ICD-10-CM

## 2021-11-28 DIAGNOSIS — M6281 Muscle weakness (generalized): Secondary | ICD-10-CM

## 2021-11-28 DIAGNOSIS — M25661 Stiffness of right knee, not elsewhere classified: Secondary | ICD-10-CM

## 2021-11-28 DIAGNOSIS — R600 Localized edema: Secondary | ICD-10-CM

## 2021-11-28 NOTE — Therapy (Signed)
OUTPATIENT PHYSICAL THERAPY TREATMENT NOTE   Patient Name: Nathaniel Bailey MRN: [id number] DOB:Jun 30, 1961, 61 y.o., male Today's Date: 11/28/2021  PCP: Merryl Hacker, No REFERRING PROVIDER: Pete Pelt, PA-C    PT End of Session - 11/28/21 1530     Visit Number 9    Number of Visits 18    Date for PT Re-Evaluation 12/15/21    PT Start Time 6948    PT Stop Time 1530    PT Time Calculation (min) 43 min    Activity Tolerance Patient tolerated treatment well    Behavior During Therapy [facility] for tasks assessed/performed            Rationale for Evaluation and Treatment Rehabilitation      Past Medical History:  Diagnosis Date   Arthritis    Headache    Hypertension    NO MEDS , patient reports sometimes elevated    Pre-diabetes    Pt denies. A1c was 7.8 06/13/21.   Past Surgical History:  Procedure Laterality Date   KNEE ARTHROSCOPY Left 03/29/2020   Procedure: LEFT KNEE ARTHROSCOPY WITH DEBRIDEMENT AND PARTIAL MEDIAL MENISCECTOMY;  Surgeon: Mcarthur Rossetti, MD;  Location: [place];  Service: Orthopedics;  Laterality: Left;   KNEE ARTHROSCOPY WITH MEDIAL MENISECTOMY Right 10/17/2017   Procedure: RIGHT KNEE ARTHROSCOPY WITH PARTIAL MEDIAL MENISCECTOMY AND DEBRIDEMENT;  Surgeon: Mcarthur Rossetti, MD;  Location: [facility];  Service: Orthopedics;  Laterality: Right;   TOTAL KNEE ARTHROPLASTY Right 09/29/2021   Procedure: Right TOTAL KNEE ARTHROPLASTY;  Surgeon: Mcarthur Rossetti, MD;  Location: [facility];  Service: Orthopedics;  Laterality: Right;   Patient Active Problem List   Diagnosis Date Noted   Status post total knee replacement, right 10/01/2021   Status post total right knee replacement 09/29/2021   S/P revision of total knee, right 09/29/2021   Unilateral primary osteoarthritis, right knee 08/31/2020   Status post arthroscopy of left knee 04/05/2020   Other tear of medial meniscus, current injury, left knee, subsequent  encounter 03/29/2020   Status post arthroscopy of right knee 10/28/2017   Medial meniscus, posterior horn derangement, right 10/17/2017    REFERRING DIAG: Z96.651 (ICD-10-CM) - Status post total right knee replacement  THERAPY DIAG:  Other abnormalities of gait and mobility  Stiffness of right knee, not elsewhere classified  Acute pain of right knee  Localized edema  Muscle weakness (generalized)  PERTINENT HISTORY: Left meniscus tear & repair, right mensicus tear & repair, OA, HTN, pre-daibetes  PRECAUTIONS: None  SUBJECTIVE:  A little less pain today.  PAIN:  Are you having pain? Yes NPRS scale: 4/10 Pain location: N/A Pain description: N/A Aggravating factors: bending, standing/walking Relieving factors: medication   OBJECTIVE: (objective measures completed at initial evaluation unless otherwise dated)   PATIENT SURVEYS:  11/01/21 FOTO  77 (predicted 71)             EDEMA: RLE: above knee 43.6cm around knee 43cm below knee 36.8cm LLE: above knee 36.3cm around knee 36cm below knee 31.8cm   POSTURE:  Stands with weight shifted to left, right knee flexed with partial weight   PALPATION: Tenderness over right knee joint line, incision, quadriceps, hamstrings & gastroc. Scar mobility limited over patella and proximal tibia.    LE ROM:   ROM P:passive  A:active Right 10/24/2021 Right 11/01/21 Right 5/11 Right 5/18  Knee flexion Supine P:  82* Seated A: 73* Supine: AA 97 Supine: AA 105 Standing AROM 110  Knee extension Supine  P:    A: -12* LAQ   AROM -3   (Blank rows = not tested)   LE MMT:   MMT Right 10/24/2021 Right 11/23/21  Knee flexion 3-/5  5/5  Knee extension 3-/5  5/5   (Blank rows = not tested)   GAIT: Distance walked: 100' Assistive device utilized: Environmental consultant - 2 wheeled Level of assistance: SBA Comments: antalgic, limited weight bearing RLE, right knee flexed in stance with minimal change in range for swing.       TODAY'S  TREATMENT: 11/28/21 Therapeutic Exercise: Bike L2x24mn Fwd and lateral step ups with R LE 2x12 Fwd step down leading with L LE x 10 - UE support Cybex knee flexion  20# x 10 bil, 10# x 10 R LE Cybex knee extension 10# bil, 5# R LE x 10 each  Gait Training: 2x170 ft w/o AD Stairs 3x 13 steps 7' - decreased ecc control leading with L LE   11/23/2021  Therapeutic Exercise: to improve strength and mobility.  Demo, verbal and tactile cues throughout for technique. Recumbent Bike L1x540m R knee AROM on step x 10 followed by runners stretch 2x30" Step down x 10 bil 2" step, 1 UE support  Side step down 2 x 10 bil 2" step  Hamstring curls 2# ankle weight at  counter x 15 R Hip abduction and extension at counter with 2# weight x 15 R Gait Training: 950 ft without device - good heel-toe pattern and hip/knee flexion, no evidence of instability or buckling, reported no increase in pain.  Vasopneumatic: Gameready at end of session for post session soreness/edema.  10 min, low compression, 34 deg (coldest).   11/21/21 Therapeutic Exercise Recumbent Bike L1x5m67mR runners stretch 2x30" R step up x 10 on 6' step;  R step up and over for ecc quad strengthening 10x with 4' step Hip abduction at counter with 2# weight 15 x  Gait Training: 180 ft with SPC - decreased TKE, good heel-toe pattern and hip/knee flexion  Vasopneumatic: Gameready at end of session for post session soreness/edema.  10 min, low compression, 34 deg (coldest).        PATIENT EDUCATION:    Access Code: [id number]:   CLINICAL IMPRESSION:  Pt demonstrates more ability to climb stairs, however he still has some difficulty with eccentric control going down leading with L LE. Able to add weight machines to treatment. Provided cueing to ensure the correct muscles are being targeted with exercises. Pt responded well to treatment.   OBJECTIVE IMPAIRMENTS Abnormal gait, decreased activity tolerance, decreased  balance, decreased knowledge of condition, decreased knowledge of use of DME, decreased mobility, decreased ROM, decreased strength, increased edema, postural dysfunction, and pain.    ACTIVITY LIMITATIONS community activity.    PERSONAL FACTORS 1-2 comorbidities: see PMH  are also affecting patient's functional outcome.      REHAB POTENTIAL: Good   CLINICAL DECISION MAKING: Stable/uncomplicated   EVALUATION COMPLEXITY: Low     GOALS: Goals reviewed with patient?    SHORT TERM GOALS: Target date: 11/17/2021   Patient independent and verbalizes compliance with initial HEP Baseline: SEE OBJECTIVE DATA Goal status: Met 11/03/2021   2.  Patient reports 50% improvement in right knee pain. Baseline: SEE OBJECTIVE DATA Goal status: Met (11/21/21 60% improvement)   3.  PROM right knee extension -3* to flexion 90* Baseline: SEE OBJECTIVE DATA Goal status: MET 3 to 110   LONG TERM GOALS: Target date: 12/15/2021   Patient will improve  FOTO score to target% Baseline: SEE OBJECTIVE DATA Goal status: Ongoing   2.  Patient reports right knee pain </= 2/10 with standing & gait activities.  Baseline: SEE OBJECTIVE DATA Goal status: Ongoing   3.  Right Knee PROM 0* extension to 100* flexion Baseline: SEE OBJECTIVE DATA Goal status: Ongoing 11/23/21 R knee AROM 3 to 110   4.  Right Knee AROM seated -3* extension to 90* flexion Baseline: SEE OBJECTIVE DATA Goal status: MET R knee 3 to 105 seated   5.  Patient ambulates >500' community distances including negotiating ramps, curbs & stairs without device independently. Baseline: SEE OBJECTIVE DATA Goal status: Ongoing     PLAN: PT FREQUENCY 2x/wk for 1 week, then 3x/week for 2 weeks, then 2x/wk for 5 weeks   PT DURATION: 8 weeks   PLANNED INTERVENTIONS: Therapeutic exercises, Therapeutic activity, Neuromuscular re-education, Balance training, Gait training, Patient/Family education, Joint mobilization, Stair training, DME instructions,  Electrical stimulation, Cryotherapy, Moist heat, scar mobilization, Taping, Vasopneumatic device, and Manual therapy   PLAN FOR NEXT SESSION: Step ups, standing TE, knee ROM, manual therapy & therapeutic exercise for knee range, end with vaso PRN    Artist Pais, PTA 11/28/21 3:31 PM

## 2021-11-30 ENCOUNTER — Ambulatory Visit: Payer: Self-pay

## 2021-11-30 DIAGNOSIS — R600 Localized edema: Secondary | ICD-10-CM

## 2021-11-30 DIAGNOSIS — R2689 Other abnormalities of gait and mobility: Secondary | ICD-10-CM

## 2021-11-30 DIAGNOSIS — M6281 Muscle weakness (generalized): Secondary | ICD-10-CM

## 2021-11-30 DIAGNOSIS — M25561 Pain in right knee: Secondary | ICD-10-CM

## 2021-11-30 DIAGNOSIS — M25661 Stiffness of right knee, not elsewhere classified: Secondary | ICD-10-CM

## 2021-11-30 NOTE — Therapy (Signed)
OUTPATIENT PHYSICAL THERAPY TREATMENT NOTE   Patient Name: Nathaniel Bailey MRN: [id number] DOB:1960-09-11, 61 y.o., male Today's Date: 11/30/2021  PCP: Merryl Hacker, No REFERRING PROVIDER: Pete Pelt, PA-C    PT End of Session - 11/30/21 1531     Visit Number 10    Number of Visits 18    Date for PT Re-Evaluation 12/15/21    PT Start Time 0623    PT Stop Time 1528    PT Time Calculation (min) 41 min    Activity Tolerance Patient tolerated treatment well    Behavior During Therapy [facility] for tasks assessed/performed             Rationale for Evaluation and Treatment Rehabilitation      Past Medical History:  Diagnosis Date   Arthritis    Headache    Hypertension    NO MEDS , patient reports sometimes elevated    Pre-diabetes    Pt denies. A1c was 7.8 06/13/21.   Past Surgical History:  Procedure Laterality Date   KNEE ARTHROSCOPY Left 03/29/2020   Procedure: LEFT KNEE ARTHROSCOPY WITH DEBRIDEMENT AND PARTIAL MEDIAL MENISCECTOMY;  Surgeon: Mcarthur Rossetti, MD;  Location: [place];  Service: Orthopedics;  Laterality: Left;   KNEE ARTHROSCOPY WITH MEDIAL MENISECTOMY Right 10/17/2017   Procedure: RIGHT KNEE ARTHROSCOPY WITH PARTIAL MEDIAL MENISCECTOMY AND DEBRIDEMENT;  Surgeon: Mcarthur Rossetti, MD;  Location: [facility];  Service: Orthopedics;  Laterality: Right;   TOTAL KNEE ARTHROPLASTY Right 09/29/2021   Procedure: Right TOTAL KNEE ARTHROPLASTY;  Surgeon: Mcarthur Rossetti, MD;  Location: [facility];  Service: Orthopedics;  Laterality: Right;   Patient Active Problem List   Diagnosis Date Noted   Status post total knee replacement, right 10/01/2021   Status post total right knee replacement 09/29/2021   S/P revision of total knee, right 09/29/2021   Unilateral primary osteoarthritis, right knee 08/31/2020   Status post arthroscopy of left knee 04/05/2020   Other tear of medial meniscus, current injury, left knee, subsequent  encounter 03/29/2020   Status post arthroscopy of right knee 10/28/2017   Medial meniscus, posterior horn derangement, right 10/17/2017    REFERRING DIAG: Z96.651 (ICD-10-CM) - Status post total right knee replacement  THERAPY DIAG:  Other abnormalities of gait and mobility  Stiffness of right knee, not elsewhere classified  Acute pain of right knee  Localized edema  Muscle weakness (generalized)  PERTINENT HISTORY: Left meniscus tear & repair, right mensicus tear & repair, OA, HTN, pre-daibetes  PRECAUTIONS: None  SUBJECTIVE:  Pt reports he is still having the most pain with going down stairs.  PAIN:  Are you having pain? Yes NPRS scale: 5/10 Pain location: R knee Pain description: ache Aggravating factors: bending, standing/walking Relieving factors: medication   OBJECTIVE: (objective measures completed at initial evaluation unless otherwise dated)   PATIENT SURVEYS:  11/01/21 FOTO  59 (predicted 49)             EDEMA: RLE: above knee 43.6cm around knee 43cm below knee 36.8cm LLE: above knee 36.3cm around knee 36cm below knee 31.8cm   POSTURE:  Stands with weight shifted to left, right knee flexed with partial weight   PALPATION: Tenderness over right knee joint line, incision, quadriceps, hamstrings & gastroc. Scar mobility limited over patella and proximal tibia.    LE ROM:   ROM P:passive  A:active Right 10/24/2021 Right 11/01/21 Right 5/11 Right 5/18 Right 5/25  Knee flexion Supine P:  82* Seated A: 73* Supine:  AA 97 Supine: AA 105 Standing AROM 110 AROM 110  Knee extension Supine P:    A: -12* LAQ   AROM -3    (Blank rows = not tested)   LE MMT:   MMT Right 10/24/2021 Right 11/23/21  Knee flexion 3-/5  5/5  Knee extension 3-/5  5/5   (Blank rows = not tested)   GAIT: Distance walked: 100' Assistive device utilized: Environmental consultant - 2 wheeled Level of assistance: SBA Comments: antalgic, limited weight bearing RLE, right knee flexed in stance  with minimal change in range for swing.       TODAY'S TREATMENT: 11/30/21 TE: Bike L2x25mn Cybex knee extension 10lb x 15 BLE; 5lb x 15 RLE Cybex flexion 20lb x 20; 10lb  x 20 Ecc step down supported on R LE x 10  Gait: Stairs 2 trials on 7' steps reciprocal- decreased ecc control leading with L LE, increased pain with descending  11/28/21 Therapeutic Exercise: Bike L2x617m Fwd and lateral step ups with R LE 2x12 Fwd step down leading with L LE x 10 - UE support Cybex knee flexion  20# x 10 bil, 10# x 10 R LE Cybex knee extension 10# bil, 5# R LE x 10 each  Gait Training: 2x170 ft w/o AD Stairs 3x 13 steps 7' - decreased ecc control leading with L LE   11/23/2021  Therapeutic Exercise: to improve strength and mobility.  Demo, verbal and tactile cues throughout for technique. Recumbent Bike L1x5m83mR knee AROM on step x 10 followed by runners stretch 2x30" Step down x 10 bil 2" step, 1 UE support  Side step down 2 x 10 bil 2" step  Hamstring curls 2# ankle weight at  counter x 15 R Hip abduction and extension at counter with 2# weight x 15 R Gait Training: 950 ft without device - good heel-toe pattern and hip/knee flexion, no evidence of instability or buckling, reported no increase in pain.  Vasopneumatic: Gameready at end of session for post session soreness/edema.  10 min, low compression, 34 deg (coldest).   11/21/21 Therapeutic Exercise Recumbent Bike L1x5mi50m runners stretch 2x30" R step up x 10 on 6' step;  R step up and over for ecc quad strengthening 10x with 4' step Hip abduction at counter with 2# weight 15 x  Gait Training: 180 ft with SPC - decreased TKE, good heel-toe pattern and hip/knee flexion  Vasopneumatic: Gameready at end of session for post session soreness/edema.  10 min, low compression, 34 deg (coldest).        PATIENT EDUCATION:    Access Code: [email or web address]:   CLINICAL IMPRESSION:  Pt is progressing well toward goals. He  still noted 5/10 pain with functional activities like stairs, with regular gait he has no pain. Knee flexion is at 110 deg now. He is able to progress with exercises well but with cueing to correct form. He would continue to benefit from PT to improve tolerance for stairs and to restore full ROM of the R knee.  OBJECTIVE IMPAIRMENTS Abnormal gait, decreased activity tolerance, decreased balance, decreased knowledge of condition, decreased knowledge of use of DME, decreased mobility, decreased ROM, decreased strength, increased edema, postural dysfunction, and pain.    ACTIVITY LIMITATIONS community activity.    PERSONAL FACTORS 1-2 comorbidities: see PMH  are also affecting patient's functional outcome.      REHAB POTENTIAL: Good   CLINICAL DECISION MAKING: Stable/uncomplicated   EVALUATION COMPLEXITY: Low     GOALS: Goals  reviewed with patient?    SHORT TERM GOALS: Target date: 11/17/2021   Patient independent and verbalizes compliance with initial HEP Baseline: SEE OBJECTIVE DATA Goal status: Met 11/03/2021   2.  Patient reports 50% improvement in right knee pain. Baseline: SEE OBJECTIVE DATA Goal status: Met (11/21/21 60% improvement)   3.  PROM right knee extension -3* to flexion 90* Baseline: SEE OBJECTIVE DATA Goal status: MET 3 to 110   LONG TERM GOALS: Target date: 12/15/2021   Patient will improve FOTO score to target% Baseline: SEE OBJECTIVE DATA Goal status: Ongoing   2.  Patient reports right knee pain </= 2/10 with standing & gait activities.  Baseline: SEE OBJECTIVE DATA Goal status: Ongoing    3.  Right Knee PROM 0* extension to 100* flexion Baseline: SEE OBJECTIVE DATA Goal status: Ongoing 11/23/21 R knee AROM 3 to 110   4.  Right Knee AROM seated -3* extension to 90* flexion Baseline: SEE OBJECTIVE DATA Goal status: MET R knee 3 to 105 seated   5.  Patient ambulates >500' community distances including negotiating ramps, curbs & stairs without device  independently. Baseline: SEE OBJECTIVE DATA Goal status: Ongoing      PLAN: PT FREQUENCY 2x/wk for 1 week, then 3x/week for 2 weeks, then 2x/wk for 5 weeks   PT DURATION: 8 weeks   PLANNED INTERVENTIONS: Therapeutic exercises, Therapeutic activity, Neuromuscular re-education, Balance training, Gait training, Patient/Family education, Joint mobilization, Stair training, DME instructions, Electrical stimulation, Cryotherapy, Moist heat, scar mobilization, Taping, Vasopneumatic device, and Manual therapy   PLAN FOR NEXT SESSION: Step ups, standing TE, knee ROM, manual therapy & therapeutic exercise for knee range, end with vaso PRN    Artist Pais, PTA 11/30/21 5:23 PM

## 2021-12-05 ENCOUNTER — Ambulatory Visit: Payer: Self-pay

## 2021-12-07 ENCOUNTER — Encounter: Payer: Self-pay | Admitting: Physician Assistant

## 2021-12-07 ENCOUNTER — Encounter: Payer: Self-pay | Admitting: Physical Therapy

## 2021-12-07 ENCOUNTER — Ambulatory Visit (INDEPENDENT_AMBULATORY_CARE_PROVIDER_SITE_OTHER): Payer: Self-pay | Admitting: Physician Assistant

## 2021-12-07 DIAGNOSIS — Z96651 Presence of right artificial knee joint: Secondary | ICD-10-CM

## 2021-12-07 MED ORDER — OXYCODONE HCL 5 MG PO TABS
5.0000 mg | ORAL_TABLET | Freq: Four times a day (QID) | ORAL | 0 refills | Status: DC | PRN
Start: 2021-12-07 — End: 2021-12-19

## 2021-12-07 MED ORDER — TIZANIDINE HCL 4 MG PO TABS
4.0000 mg | ORAL_TABLET | Freq: Four times a day (QID) | ORAL | 1 refills | Status: DC | PRN
Start: 2021-12-07 — End: 2022-01-15

## 2021-12-07 NOTE — Progress Notes (Signed)
    HPI: Jos returns today status post right total knee arthroplasty 09/29/2021.  He states that he is slowly improving but still having pain 6 out of 10 at worst.  He is asking for refill on his oxycodone muscle relaxant.  Has been going to therapy.  He presents with an interpreter today.  Review of systems see HPI otherwise negative  Physical exam: Right knee full extension flexion 110 degrees no gross instability valgus varus stressing.  Surgical incisions well-healed.  Calf supple nontender dorsiflexion plantarflexion right ankle intact.   Impression: Status post right total knee arthroplasty 09/29/2021  Plan refilled his oxycodone today next refill will need to be Norco at night discussed this with him today.  Also refilled his muscle relaxant.  He will continue work with therapy on range of motion strengthening.  We will see him back in 6 weeks see how he is doing overall.  Questions were encouraged and answered using interpreter today.

## 2021-12-19 ENCOUNTER — Encounter: Payer: Self-pay | Admitting: Physical Therapy

## 2021-12-19 ENCOUNTER — Ambulatory Visit: Payer: Self-pay | Attending: Physician Assistant | Admitting: Physical Therapy

## 2021-12-19 ENCOUNTER — Other Ambulatory Visit: Payer: Self-pay | Admitting: Orthopaedic Surgery

## 2021-12-19 ENCOUNTER — Telehealth: Payer: Self-pay | Admitting: Orthopaedic Surgery

## 2021-12-19 DIAGNOSIS — M25561 Pain in right knee: Secondary | ICD-10-CM | POA: Insufficient documentation

## 2021-12-19 DIAGNOSIS — R2689 Other abnormalities of gait and mobility: Secondary | ICD-10-CM | POA: Insufficient documentation

## 2021-12-19 DIAGNOSIS — R600 Localized edema: Secondary | ICD-10-CM | POA: Insufficient documentation

## 2021-12-19 DIAGNOSIS — R2681 Unsteadiness on feet: Secondary | ICD-10-CM | POA: Insufficient documentation

## 2021-12-19 DIAGNOSIS — M25661 Stiffness of right knee, not elsewhere classified: Secondary | ICD-10-CM | POA: Insufficient documentation

## 2021-12-19 DIAGNOSIS — M6281 Muscle weakness (generalized): Secondary | ICD-10-CM | POA: Insufficient documentation

## 2021-12-19 MED ORDER — HYDROCODONE-ACETAMINOPHEN 7.5-325 MG PO TABS: 1.0000 | ORAL_TABLET | Freq: Four times a day (QID) | ORAL | 0 refills | Status: DC | PRN

## 2021-12-19 NOTE — Therapy (Signed)
OUTPATIENT PHYSICAL THERAPY TREATMENT NOTE   Patient Name: Nathaniel Bailey MRN: [id number] DOB:1961-02-28, 61 y.o., male Today's Date: 12/19/2021  PCP: Merryl Hacker, No REFERRING PROVIDER: Pete Pelt, PA-C    PT End of Session - 12/19/21 1538     Visit Number 11    Number of Visits 18    Date for PT Re-Evaluation 01/30/22    PT Start Time 1532    PT Stop Time 1617    PT Time Calculation (min) 45 min    Activity Tolerance Patient tolerated treatment well    Behavior During Therapy [facility] for tasks assessed/performed             Rationale for Evaluation and Treatment Rehabilitation      Past Medical History:  Diagnosis Date   Arthritis    Headache    Hypertension    NO MEDS , patient reports sometimes elevated    Pre-diabetes    Pt denies. A1c was 7.8 06/13/21.   Past Surgical History:  Procedure Laterality Date   KNEE ARTHROSCOPY Left 03/29/2020   Procedure: LEFT KNEE ARTHROSCOPY WITH DEBRIDEMENT AND PARTIAL MEDIAL MENISCECTOMY;  Surgeon: Mcarthur Rossetti, MD;  Location: [place];  Service: Orthopedics;  Laterality: Left;   KNEE ARTHROSCOPY WITH MEDIAL MENISECTOMY Right 10/17/2017   Procedure: RIGHT KNEE ARTHROSCOPY WITH PARTIAL MEDIAL MENISCECTOMY AND DEBRIDEMENT;  Surgeon: Mcarthur Rossetti, MD;  Location: [facility];  Service: Orthopedics;  Laterality: Right;   TOTAL KNEE ARTHROPLASTY Right 09/29/2021   Procedure: Right TOTAL KNEE ARTHROPLASTY;  Surgeon: Mcarthur Rossetti, MD;  Location: [facility];  Service: Orthopedics;  Laterality: Right;   Patient Active Problem List   Diagnosis Date Noted   Status post total knee replacement, right 10/01/2021   Status post total right knee replacement 09/29/2021   S/P revision of total knee, right 09/29/2021   Unilateral primary osteoarthritis, right knee 08/31/2020   Status post arthroscopy of left knee 04/05/2020   Other tear of medial meniscus, current injury, left knee, subsequent  encounter 03/29/2020   Status post arthroscopy of right knee 10/28/2017   Medial meniscus, posterior horn derangement, right 10/17/2017    REFERRING DIAG: Z96.651 (ICD-10-CM) - Status post total right knee replacement  THERAPY DIAG:  Other abnormalities of gait and mobility  Stiffness of right knee, not elsewhere classified  Acute pain of right knee  Localized edema  Muscle weakness (generalized)  Unsteadiness on feet  PERTINENT HISTORY: Left meniscus tear & repair, right mensicus tear & repair, OA, HTN, pre-daibetes  PRECAUTIONS: None  SUBJECTIVE:  Pt reports he walked 30 min, and after that it started hurting that night and next day.   Sometimes his whole body aches and not sure if its because of the surgery.   Still having pain on stairs as well.  Has not returned to work, some days don't want to get up, feels too down.    PAIN:  Are you having pain? Yes NPRS scale: 3/10 Pain location: R knee Pain description: ache Aggravating factors: bending, standing/walking Relieving factors: medication   OBJECTIVE: (objective measures completed at initial evaluation unless otherwise dated)   PATIENT SURVEYS:  11/01/21 FOTO  49 (predicted 32)             EDEMA: RLE: above knee 43.6cm around knee 43cm below knee 36.8cm LLE: above knee 36.3cm around knee 36cm below knee 31.8cm   POSTURE:  Stands with weight shifted to left, right knee flexed with partial weight   PALPATION:  Tenderness over right knee joint line, incision, quadriceps, hamstrings & gastroc. Scar mobility limited over patella and proximal tibia.    LE ROM:   ROM P:passive  A:active Right 10/24/2021 Right 11/01/21 Right 5/11 Right 5/18 Right 5/25 12/19/21  Knee flexion Supine P:  82* Seated A: 73* Supine: AA 97 Supine: AA 105 Standing AROM 110 AROM 110 110  Knee extension Supine P:    A: -12* LAQ   AROM -3  0   (Blank rows = not tested)   LE MMT:   MMT Right 10/24/2021 Right 11/23/21  Knee  flexion 3-/5  5/5  Knee extension 3-/5  5/5   (Blank rows = not tested)   GAIT: Distance walked: 100' Assistive device utilized: Environmental consultant - 2 wheeled Level of assistance: SBA Comments: antalgic, limited weight bearing RLE, right knee flexed in stance with minimal change in range for swing.       TODAY'S TREATMENT: 12/19/2021 Therapeutic Activities: to assess progress towards goals.  Bike L2 x 8 min  Goals Education on normal healing and anatomy  Manual Therapy: to decrease muscle spasm and pain and improve mobility IASTM with s/s tools and STM to R rectus femoris and vastus lateralis.  Skilled palpation and monitoring with dyr needling.  Trigger Point Dry-Needling  Treatment instructions: Expect mild to moderate muscle soreness. S/S of pneumothorax if dry needled over a lung field, and to seek immediate medical attention should they occur. Patient verbalized understanding of these instructions and education.  Patient Consent Given: Yes Education handout provided: Yes Muscles treated: R vastus lateralis Electrical stimulation performed: No Parameters: N/A Treatment response/outcome: Palpable Increase in Muscle Length  but very poorly tolerated, patient reported increase pain, stopped intervention.    11/30/21 TE: Bike L2x23mn Cybex knee extension 10lb x 15 BLE; 5lb x 15 RLE Cybex flexion 20lb x 20; 10lb  x 20 Ecc step down supported on R LE x 10  Gait: Stairs 2 trials on 7' steps reciprocal- decreased ecc control leading with L LE, increased pain with descending  11/28/21 Therapeutic Exercise: Bike L2x622m Fwd and lateral step ups with R LE 2x12 Fwd step down leading with L LE x 10 - UE support Cybex knee flexion  20# x 10 bil, 10# x 10 R LE Cybex knee extension 10# bil, 5# R LE x 10 each  Gait Training: 2x170 ft w/o AD Stairs 3x 13 steps 7' - decreased ecc control leading with L LE    PATIENT EDUCATION:    Access Code: [email or web address] ASSESSMENT:   CLINICAL  IMPRESSION:  Pt is has met most of goals but continues to report R knee pain with gait and stairs.  His R knee ROM is now 0-110 and strength is 5/5 with no quad lag.  He reports a lot of pain and tightness in R lateral knee, and quads, and numbness in lateral knee.  He reports generalized pain and some depression as well, and stress due to inability to work, he is 5 months behind on rent.  Today trialed DN after explanation and demonstration, but he tolerated poorly and so stopped after 1 needle inserted into vastus lateralis, instead focused on STM and TPR to quads, after which he reported decreased pain.  Educated on surgery (he thought maybe someone else's knee was put into him, he did not understand the procedure) and expected tissue healing, reassured that he is healing well and should continue to improve.  Due to continued pain and poor activity tolerance recommend  continuing PT 1x/week for 4 more visits.    OBJECTIVE IMPAIRMENTS Abnormal gait, decreased activity tolerance, decreased balance, decreased knowledge of condition, decreased knowledge of use of DME, decreased mobility, decreased ROM, decreased strength, increased edema, postural dysfunction, and pain.    ACTIVITY LIMITATIONS community activity.    PERSONAL FACTORS 1-2 comorbidities: see PMH  are also affecting patient's functional outcome.      REHAB POTENTIAL: Good   CLINICAL DECISION MAKING: Stable/uncomplicated   EVALUATION COMPLEXITY: Low     GOALS: Goals reviewed with patient?    SHORT TERM GOALS: Target date: 11/17/2021   Patient independent and verbalizes compliance with initial HEP Baseline: SEE OBJECTIVE DATA Goal status: Met 11/03/2021   2.  Patient reports 50% improvement in right knee pain. Baseline: SEE OBJECTIVE DATA Goal status: Met (11/21/21 60% improvement)   3.  PROM right knee extension -3* to flexion 90* Baseline: SEE OBJECTIVE DATA Goal status: MET 3 to 110   LONG TERM GOALS: Target date: 12/15/2021     Patient will improve FOTO score to target% Baseline: SEE OBJECTIVE DATA Goal status: Ongoing   2.  Patient reports right knee pain </= 2/10 with standing & gait activities.  Baseline: SEE OBJECTIVE DATA Goal status: Ongoing 12/19/21 still having 3-5/10 R knee pian with activities .   3.  Right Knee PROM 0* extension to 100* flexion Baseline: SEE OBJECTIVE DATA Goal status: MET 12/19/21 R knee AROM 0 to 110* flexion   4.  Right Knee AROM seated -3* extension to 90* flexion Baseline: SEE OBJECTIVE DATA Goal status: MET R knee 3 to 105 seated   5.  Patient ambulates >500' community distances including negotiating ramps, curbs & stairs without device independently. Baseline: SEE OBJECTIVE DATA Goal status: MET     PLAN: PT FREQUENCY: continue 1x/week for 4 more visits.    PT DURATION: 4-6 weeks   PLANNED INTERVENTIONS: Therapeutic exercises, Therapeutic activity, Neuromuscular re-education, Balance training, Gait training, Patient/Family education, Joint mobilization, Stair training, DME instructions, Electrical stimulation, Cryotherapy, Moist heat, scar mobilization, Taping, Vasopneumatic device, and Manual therapy   PLAN FOR NEXT SESSION: stairs, manual therapy, modalities PRN.  Complete FOTO for knee.      Rennie Natter, PT, DPT  12/19/21 4:36 PM

## 2021-12-19 NOTE — Telephone Encounter (Signed)
Patient called. He would like a refill on his pain medication. His call back number is [phone] 

## 2022-01-04 ENCOUNTER — Other Ambulatory Visit: Payer: Self-pay | Admitting: Orthopaedic Surgery

## 2022-01-04 ENCOUNTER — Telehealth: Payer: Self-pay | Admitting: Orthopaedic Surgery

## 2022-01-04 MED ORDER — HYDROCODONE-ACETAMINOPHEN 7.5-325 MG PO TABS: 1.0000 | ORAL_TABLET | Freq: Three times a day (TID) | ORAL | 0 refills | Status: DC | PRN

## 2022-01-04 NOTE — Telephone Encounter (Signed)
Pt called and requesting refill on hydrocodone

## 2022-01-13 ENCOUNTER — Other Ambulatory Visit: Payer: Self-pay | Admitting: Physician Assistant

## 2022-01-16 ENCOUNTER — Encounter: Payer: Self-pay | Admitting: Physical Therapy

## 2022-01-16 ENCOUNTER — Ambulatory Visit: Payer: Self-pay | Attending: Physician Assistant | Admitting: Physical Therapy

## 2022-01-16 DIAGNOSIS — R2689 Other abnormalities of gait and mobility: Secondary | ICD-10-CM

## 2022-01-16 DIAGNOSIS — M25561 Pain in right knee: Secondary | ICD-10-CM

## 2022-01-16 DIAGNOSIS — M25661 Stiffness of right knee, not elsewhere classified: Secondary | ICD-10-CM

## 2022-01-16 DIAGNOSIS — M6281 Muscle weakness (generalized): Secondary | ICD-10-CM

## 2022-01-16 DIAGNOSIS — R600 Localized edema: Secondary | ICD-10-CM

## 2022-01-16 NOTE — Therapy (Signed)
OUTPATIENT PHYSICAL THERAPY TREATMENT NOTE   Patient Name: Roddrick Sharron MRN: [id number] DOB:[birth year], 61 y.o., male Today's Date: 01/16/2022  PCP: Merryl Hacker, No REFERRING PROVIDER: Pete Pelt, PA-C    PT End of Session - 01/16/22 1537     Visit Number 12    Number of Visits 18    Date for PT Re-Evaluation 01/30/22    PT Start Time 1532    PT Stop Time 1620    PT Time Calculation (min) 48 min    Activity Tolerance Patient tolerated treatment well    Behavior During Therapy [facility] for tasks assessed/performed             Rationale for Evaluation and Treatment Rehabilitation      Past Medical History:  Diagnosis Date   Arthritis    Headache    Hypertension    NO MEDS , patient reports sometimes elevated    Pre-diabetes    Pt denies. A1c was 7.8 06/13/21.   Past Surgical History:  Procedure Laterality Date   KNEE ARTHROSCOPY Left 03/29/2020   Procedure: LEFT KNEE ARTHROSCOPY WITH DEBRIDEMENT AND PARTIAL MEDIAL MENISCECTOMY;  Surgeon: Mcarthur Rossetti, MD;  Location: [place];  Service: Orthopedics;  Laterality: Left;   KNEE ARTHROSCOPY WITH MEDIAL MENISECTOMY Right 10/17/2017   Procedure: RIGHT KNEE ARTHROSCOPY WITH PARTIAL MEDIAL MENISCECTOMY AND DEBRIDEMENT;  Surgeon: Mcarthur Rossetti, MD;  Location: [facility];  Service: Orthopedics;  Laterality: Right;   TOTAL KNEE ARTHROPLASTY Right 09/29/2021   Procedure: Right TOTAL KNEE ARTHROPLASTY;  Surgeon: Mcarthur Rossetti, MD;  Location: [facility];  Service: Orthopedics;  Laterality: Right;   Patient Active Problem List   Diagnosis Date Noted   Status post total knee replacement, right 10/01/2021   Status post total right knee replacement 09/29/2021   S/P revision of total knee, right 09/29/2021   Unilateral primary osteoarthritis, right knee 08/31/2020   Status post arthroscopy of left knee 04/05/2020   Other tear of medial meniscus, current injury, left knee, subsequent  encounter 03/29/2020   Status post arthroscopy of right knee 10/28/2017   Medial meniscus, posterior horn derangement, right 10/17/2017    REFERRING DIAG: Z96.651 (ICD-10-CM) - Status post total right knee replacement  THERAPY DIAG:  Other abnormalities of gait and mobility  Stiffness of right knee, not elsewhere classified  Acute pain of right knee  Localized edema  Muscle weakness (generalized)  PERTINENT HISTORY: Left meniscus tear & repair, right mensicus tear & repair, OA, HTN, pre-daibetes  PRECAUTIONS: None  SUBJECTIVE:  Pt reports R knee and foot are still hurting, especially if he does not take medicine.  He is just taking pills every 12 hours now instead of 6 hours.    PAIN:  Are you having pain? Yes NPRS scale: 4-5/10 Pain location: R knee Pain description: ache Aggravating factors: bending, standing/walking Relieving factors: medication   OBJECTIVE: (objective measures completed at initial evaluation unless otherwise dated)   PATIENT SURVEYS:  11/01/21 FOTO  45 (predicted 76)             EDEMA: RLE: above knee 43.6cm around knee 43cm below knee 36.8cm LLE: above knee 36.3cm around knee 36cm below knee 31.8cm   POSTURE:  Stands with weight shifted to left, right knee flexed with partial weight   PALPATION: Tenderness over right knee joint line, incision, quadriceps, hamstrings & gastroc. Scar mobility limited over patella and proximal tibia.    LE ROM:   ROM P:passive  A:active Right 10/24/2021  Right 11/01/21 Right 5/11 Right 5/18 Right 5/25 12/19/21  Knee flexion Supine P:  82* Seated A: 73* Supine: AA 97 Supine: AA 105 Standing AROM 110 AROM 110 110  Knee extension Supine P:    A: -12* LAQ   AROM -3  0   (Blank rows = not tested)   LE MMT:   MMT Right 10/24/2021 Right 11/23/21  Knee flexion 3-/5  5/5  Knee extension 3-/5  5/5   (Blank rows = not tested)   GAIT: Distance walked: 100' Assistive device utilized: Environmental consultant - 2  wheeled Level of assistance: SBA Comments: antalgic, limited weight bearing RLE, right knee flexed in stance with minimal change in range for swing.       TODAY'S TREATMENT: 01/16/2022 Therapeutic Exercise: to improve strength and mobility.  Demo, verbal and tactile cues throughout for technique. Bike L2 x 8 min  Cybex leg extension 25# 2 x 10 cues for eccentric control Cybex leg flexion 25# 2 x 10  Cybex leg press 35# x 15 - L knee started to hurt Manual Therapy: to decrease muscle spasm and pain and improve mobility IASTM with s/s tools to R quadriceps, patellar tendon, patellar mobs.    12/19/2021 Therapeutic Activities: to assess progress towards goals.  Bike L2 x 8 min  Goals Education on normal healing and anatomy  Manual Therapy: to decrease muscle spasm and pain and improve mobility IASTM with s/s tools and STM to R rectus femoris and vastus lateralis.  Skilled palpation and monitoring with dyr needling.  Trigger Point Dry-Needling  Treatment instructions: Expect mild to moderate muscle soreness. S/S of pneumothorax if dry needled over a lung field, and to seek immediate medical attention should they occur. Patient verbalized understanding of these instructions and education.  Patient Consent Given: Yes Education handout provided: Yes Muscles treated: R vastus lateralis Electrical stimulation performed: No Parameters: N/A Treatment response/outcome: Palpable Increase in Muscle Length  but very poorly tolerated, patient reported increase pain, stopped intervention.   11/30/21 TE: Bike L2x53mn Cybex knee extension 10lb x 15 BLE; 5lb x 15 RLE Cybex flexion 20lb x 20; 10lb  x 20 Ecc step down supported on R LE x 10  Gait: Stairs 2 trials on 7' steps reciprocal- decreased ecc control leading with L LE, increased pain with descending     PATIENT EDUCATION:    Access Code: [email or web address]  ASSESSMENT:   CLINICAL IMPRESSION:  JFarhaan Mabeecontinues to report R  knee pain and tightness.  He is still very confused about surgery, explained again using knee model, recommended he look up some animated educational videos as this may help him understand.  He demonstrated improvement in RLE strength today, tolerating significant increase in resistance on cybex, limited more by left knee pain.  Noted continues to have tightness in distal quad and especially in patellar tendon with manual therapy.  At end of session requested letter for employer, as he is having trouble getting work due to recent surgery.    OBJECTIVE IMPAIRMENTS Abnormal gait, decreased activity tolerance, decreased balance, decreased knowledge of condition, decreased knowledge of use of DME, decreased mobility, decreased ROM, decreased strength, increased edema, postural dysfunction, and pain.    ACTIVITY LIMITATIONS community activity.    PERSONAL FACTORS 1-2 comorbidities: see PMH  are also affecting patient's functional outcome.      REHAB POTENTIAL: Good   CLINICAL DECISION MAKING: Stable/uncomplicated   EVALUATION COMPLEXITY: Low     GOALS: Goals reviewed with patient?  SHORT TERM GOALS: Target date: 11/17/2021   Patient independent and verbalizes compliance with initial HEP Baseline: SEE OBJECTIVE DATA Goal status: Met 11/03/2021   2.  Patient reports 50% improvement in right knee pain. Baseline: SEE OBJECTIVE DATA Goal status: Met (11/21/21 60% improvement)   3.  PROM right knee extension -3* to flexion 90* Baseline: SEE OBJECTIVE DATA Goal status: MET 3 to 110   LONG TERM GOALS: Target date: 12/15/2021    Patient will improve FOTO score to target% Baseline: SEE OBJECTIVE DATA Goal status: Ongoing   2.  Patient reports right knee pain </= 2/10 with standing & gait activities.  Baseline: SEE OBJECTIVE DATA Goal status: Ongoing 12/19/21 still having 3-5/10 R knee pian with activities .   3.  Right Knee PROM 0* extension to 100* flexion Baseline: SEE OBJECTIVE DATA Goal  status: MET 12/19/21 R knee AROM 0 to 110* flexion   4.  Right Knee AROM seated -3* extension to 90* flexion Baseline: SEE OBJECTIVE DATA Goal status: MET R knee 3 to 105 seated   5.  Patient ambulates >500' community distances including negotiating ramps, curbs & stairs without device independently. Baseline: SEE OBJECTIVE DATA Goal status: MET     PLAN: PT FREQUENCY: continue 1x/week for 4 more visits.    PT DURATION: 4-6 weeks   PLANNED INTERVENTIONS: Therapeutic exercises, Therapeutic activity, Neuromuscular re-education, Balance training, Gait training, Patient/Family education, Joint mobilization, Stair training, DME instructions, Electrical stimulation, Cryotherapy, Moist heat, scar mobilization, Taping, Vasopneumatic device, and Manual therapy   PLAN FOR NEXT SESSION: stairs, manual therapy, modalities PRN.  Complete FOTO for knee.      Rennie Natter, PT, DPT  01/16/22 5:33 PM

## 2022-01-17 ENCOUNTER — Ambulatory Visit (INDEPENDENT_AMBULATORY_CARE_PROVIDER_SITE_OTHER): Payer: Self-pay | Admitting: Orthopaedic Surgery

## 2022-01-17 ENCOUNTER — Encounter: Payer: Self-pay | Admitting: Orthopaedic Surgery

## 2022-01-17 DIAGNOSIS — Z96651 Presence of right artificial knee joint: Secondary | ICD-10-CM

## 2022-01-17 NOTE — Progress Notes (Signed)
The patient is now over 3 months status post a right total knee arthroplasty.  He still has some discomfort and pain in the bottom of both feet.  He does wear not supportive shoes which are crocs.  He says the knee replacement is doing well.  He is being seen with an interpreter.  He is non-English-speaking.  His right operative knee has full extension to almost full flexion.  There is mild swelling to be expected.  He feels ligamentously stable and the knee is nice and straight.  His left knee shows mild to moderate valgus malalignment.  At this point I do not need to see him back for 3 months.  At that visit we will have a standing AP and lateral of his right operative knee.  I did give him a note stating that he can work from my standpoint as well.  All question concerns were answered and addressed.

## 2022-01-23 ENCOUNTER — Telehealth: Payer: Self-pay | Admitting: Orthopaedic Surgery

## 2022-01-23 ENCOUNTER — Ambulatory Visit: Payer: Self-pay

## 2022-01-23 ENCOUNTER — Other Ambulatory Visit: Payer: Self-pay | Admitting: Orthopaedic Surgery

## 2022-01-23 MED ORDER — HYDROCODONE-ACETAMINOPHEN 7.5-325 MG PO TABS: 1.0000 | ORAL_TABLET | Freq: Three times a day (TID) | ORAL | 0 refills | Status: DC | PRN

## 2022-01-23 NOTE — Telephone Encounter (Signed)
Patient request for refill on hydrocodone he is completely out

## 2022-01-23 NOTE — Telephone Encounter (Signed)
Please advise 

## 2022-01-30 ENCOUNTER — Ambulatory Visit: Payer: Self-pay

## 2022-01-30 DIAGNOSIS — R2689 Other abnormalities of gait and mobility: Secondary | ICD-10-CM

## 2022-01-30 DIAGNOSIS — M25561 Pain in right knee: Secondary | ICD-10-CM

## 2022-01-30 DIAGNOSIS — M25661 Stiffness of right knee, not elsewhere classified: Secondary | ICD-10-CM

## 2022-01-30 DIAGNOSIS — R600 Localized edema: Secondary | ICD-10-CM

## 2022-01-30 DIAGNOSIS — M6281 Muscle weakness (generalized): Secondary | ICD-10-CM

## 2022-01-30 NOTE — Therapy (Signed)
OUTPATIENT PHYSICAL THERAPY TREATMENT NOTE   Patient Name: Nathaniel Bailey MRN: [id number] DOB:08/21/1960, 61 y.o., male Today's Date: 01/30/2022  PCP: Merryl Hacker, No REFERRING PROVIDER: Pete Pelt, PA-C    PT End of Session - 01/30/22 1615     Visit Number 13    Number of Visits 18    Date for PT Re-Evaluation 01/30/22    PT Start Time 5053    PT Stop Time 1615    PT Time Calculation (min) 45 min    Activity Tolerance Patient tolerated treatment well    Behavior During Therapy [facility] for tasks assessed/performed              Rationale for Evaluation and Treatment Rehabilitation      Past Medical History:  Diagnosis Date   Arthritis    Headache    Hypertension    NO MEDS , patient reports sometimes elevated    Pre-diabetes    Pt denies. A1c was 7.8 06/13/21.   Past Surgical History:  Procedure Laterality Date   KNEE ARTHROSCOPY Left 03/29/2020   Procedure: LEFT KNEE ARTHROSCOPY WITH DEBRIDEMENT AND PARTIAL MEDIAL MENISCECTOMY;  Surgeon: Mcarthur Rossetti, MD;  Location: [place];  Service: Orthopedics;  Laterality: Left;   KNEE ARTHROSCOPY WITH MEDIAL MENISECTOMY Right 10/17/2017   Procedure: RIGHT KNEE ARTHROSCOPY WITH PARTIAL MEDIAL MENISCECTOMY AND DEBRIDEMENT;  Surgeon: Mcarthur Rossetti, MD;  Location: [facility];  Service: Orthopedics;  Laterality: Right;   TOTAL KNEE ARTHROPLASTY Right 09/29/2021   Procedure: Right TOTAL KNEE ARTHROPLASTY;  Surgeon: Mcarthur Rossetti, MD;  Location: [facility];  Service: Orthopedics;  Laterality: Right;   Patient Active Problem List   Diagnosis Date Noted   Status post total knee replacement, right 10/01/2021   Status post total right knee replacement 09/29/2021   S/P revision of total knee, right 09/29/2021   Unilateral primary osteoarthritis, right knee 08/31/2020   Status post arthroscopy of left knee 04/05/2020   Other tear of medial meniscus, current injury, left knee, subsequent  encounter 03/29/2020   Status post arthroscopy of right knee 10/28/2017   Medial meniscus, posterior horn derangement, right 10/17/2017    REFERRING DIAG: Z96.651 (ICD-10-CM) - Status post total right knee replacement  THERAPY DIAG:  Other abnormalities of gait and mobility  Stiffness of right knee, not elsewhere classified  Acute pain of right knee  Localized edema  Muscle weakness (generalized)  PERTINENT HISTORY: Left meniscus tear & repair, right mensicus tear & repair, OA, HTN, pre-daibetes  PRECAUTIONS: None  SUBJECTIVE:  knee still hurts and now the sole of the R foot is sensitive.  PAIN:  Are you having pain? Yes NPRS scale: 4/10 Pain location: R knee Pain description: ache Aggravating factors: bending, standing/walking Relieving factors: medication   OBJECTIVE: (objective measures completed at initial evaluation unless otherwise dated)   PATIENT SURVEYS:  11/01/21 FOTO  43 (predicted 61)             EDEMA: RLE: above knee 43.6cm around knee 43cm below knee 36.8cm LLE: above knee 36.3cm around knee 36cm below knee 31.8cm   POSTURE:  Stands with weight shifted to left, right knee flexed with partial weight   PALPATION: Tenderness over right knee joint line, incision, quadriceps, hamstrings & gastroc. Scar mobility limited over patella and proximal tibia.    LE ROM:   ROM P:passive  A:active Right 10/24/2021 Right 11/01/21 Right 5/11 Right 5/18 Right 5/25 12/19/21 Right 01/30/22  Knee flexion Supine P:  82*  Seated A: 73* Supine: AA 97 Supine: AA 105 Standing AROM 110 AROM 110 110 117  Knee extension Supine P:    A: -12* LAQ   AROM -3  0 0   (Blank rows = not tested)   LE MMT:   MMT Right 10/24/2021 Right 11/23/21  Knee flexion 3-/5  5/5  Knee extension 3-/5  5/5   (Blank rows = not tested)   GAIT: Distance walked: 100' Assistive device utilized: Environmental consultant - 2 wheeled Level of assistance: SBA Comments: antalgic, limited weight bearing RLE,  right knee flexed in stance with minimal change in range for swing.       TODAY'S TREATMENT: 01/30/22 Therapeutic Exercise: Bike L3x53mn Assessed LTGs Spoke with pt via interpreter about progress, return to work, financial assistance Complete FOTO - 70%  01/16/2022 Therapeutic Exercise: to improve strength and mobility.  Demo, verbal and tactile cues throughout for technique. Bike L2 x 8 min  Cybex leg extension 25# 2 x 10 cues for eccentric control Cybex leg flexion 25# 2 x 10  Cybex leg press 35# x 15 - L knee started to hurt Manual Therapy: to decrease muscle spasm and pain and improve mobility IASTM with s/s tools to R quadriceps, patellar tendon, patellar mobs.    12/19/2021 Therapeutic Activities: to assess progress towards goals.  Bike L2 x 8 min  Goals Education on normal healing and anatomy  Manual Therapy: to decrease muscle spasm and pain and improve mobility IASTM with s/s tools and STM to R rectus femoris and vastus lateralis.  Skilled palpation and monitoring with dyr needling.  Trigger Point Dry-Needling  Treatment instructions: Expect mild to moderate muscle soreness. S/S of pneumothorax if dry needled over a lung field, and to seek immediate medical attention should they occur. Patient verbalized understanding of these instructions and education.  Patient Consent Given: Yes Education handout provided: Yes Muscles treated: R vastus lateralis Electrical stimulation performed: No Parameters: N/A Treatment response/outcome: Palpable Increase in Muscle Length  but very poorly tolerated, patient reported increase pain, stopped intervention.   11/30/21 TE: Bike L2x620m Cybex knee extension 10lb x 15 BLE; 5lb x 15 RLE Cybex flexion 20lb x 20; 10lb  x 20 Ecc step down supported on R LE x 10  Gait: Stairs 2 trials on 7' steps reciprocal- decreased ecc control leading with L LE, increased pain with descending     PATIENT EDUCATION:    Access Code:  [email or web address] ASSESSMENT:   CLINICAL IMPRESSION:  Pt has now met all goals. R knee ROM is WFL, he is able to participate in functional activities w/ no limitation by pain. He is still having pain with stairs but not limited by it. FOTO score improved above the predicted D/C score. He now has a note to return to work, pt voiced financial concerns about insurance and not working ever since surgery, I reinforced the info about [facility] financial assistance. He asked a lot of questions with regard to returning to work and rehab, I assured him that he made great progress and could continue 1x per week but pt opted to do 30 day hold. Based on his progress with goals he seems to be able to transition to HEP but would like to come in last appointment to speak with PT going forward.  OBJECTIVE IMPAIRMENTS Abnormal gait, decreased activity tolerance, decreased balance, decreased knowledge of condition, decreased knowledge of use of DME, decreased mobility, decreased ROM, decreased strength, increased edema, postural dysfunction, and pain.  ACTIVITY LIMITATIONS community activity.    PERSONAL FACTORS 1-2 comorbidities: see PMH  are also affecting patient's functional outcome.      REHAB POTENTIAL: Good   CLINICAL DECISION MAKING: Stable/uncomplicated   EVALUATION COMPLEXITY: Low     GOALS: Goals reviewed with patient?    SHORT TERM GOALS: Target date: 11/17/2021   Patient independent and verbalizes compliance with initial HEP Baseline: SEE OBJECTIVE DATA Goal status: Met 11/03/2021   2.  Patient reports 50% improvement in right knee pain. Baseline: SEE OBJECTIVE DATA Goal status: Met (11/21/21 60% improvement)   3.  PROM right knee extension -3* to flexion 90* Baseline: SEE OBJECTIVE DATA Goal status: MET 3 to 110   LONG TERM GOALS: Target date: 12/15/2021    Patient will improve FOTO score to target% Baseline: SEE OBJECTIVE DATA Goal status: MET - 01/30/22   2.  Patient reports right knee  pain </= 2/10 with standing & gait activities.  Baseline: SEE OBJECTIVE DATA Goal status: MET - 01/30/22 - no pain reported with standing & gait   3.  Right Knee PROM 0* extension to 100* flexion Baseline: SEE OBJECTIVE DATA Goal status: MET 12/19/21 R knee AROM 0 to 110* flexion   4.  Right Knee AROM seated -3* extension to 90* flexion Baseline: SEE OBJECTIVE DATA Goal status: MET R knee 3 to 105 seated   5.  Patient ambulates >500' community distances including negotiating ramps, curbs & stairs without device independently. Baseline: SEE OBJECTIVE DATA Goal status: MET     PLAN: PT FREQUENCY: continue 1x/week for 4 more visits.    PT DURATION: 4-6 weeks   PLANNED INTERVENTIONS: Therapeutic exercises, Therapeutic activity, Neuromuscular re-education, Balance training, Gait training, Patient/Family education, Joint mobilization, Stair training, DME instructions, Electrical stimulation, Cryotherapy, Moist heat, scar mobilization, Taping, Vasopneumatic device, and Manual therapy   PLAN FOR NEXT SESSION: plan for 30 day hold    Artist Pais, PTA 01/30/22 4:15 PM

## 2022-01-31 ENCOUNTER — Other Ambulatory Visit: Payer: Self-pay | Admitting: Physician Assistant

## 2022-02-06 ENCOUNTER — Encounter: Payer: Self-pay | Admitting: Physical Therapy

## 2022-02-06 ENCOUNTER — Ambulatory Visit: Payer: Self-pay | Attending: Physician Assistant | Admitting: Physical Therapy

## 2022-02-06 DIAGNOSIS — R2689 Other abnormalities of gait and mobility: Secondary | ICD-10-CM | POA: Insufficient documentation

## 2022-02-06 DIAGNOSIS — R600 Localized edema: Secondary | ICD-10-CM | POA: Insufficient documentation

## 2022-02-06 DIAGNOSIS — M25661 Stiffness of right knee, not elsewhere classified: Secondary | ICD-10-CM | POA: Insufficient documentation

## 2022-02-06 DIAGNOSIS — M25561 Pain in right knee: Secondary | ICD-10-CM | POA: Insufficient documentation

## 2022-02-06 NOTE — Therapy (Signed)
OUTPATIENT PHYSICAL THERAPY TREATMENT NOTE   Patient Name: Nathaniel Bailey MRN: [id number] DOB:10/28/1960, 61 y.o., male Today's Date: 02/06/2022  PCP: Merryl Hacker, No REFERRING PROVIDER: Pete Pelt, PA-C    PT End of Session - 02/06/22 1539     Visit Number 14    Number of Visits 18    Date for PT Re-Evaluation 01/30/22    PT Start Time 1534    PT Stop Time 7628    PT Time Calculation (min) 40 min    Activity Tolerance Patient tolerated treatment well    Behavior During Therapy [facility] for tasks assessed/performed              Rationale for Evaluation and Treatment Rehabilitation      Past Medical History:  Diagnosis Date   Arthritis    Headache    Hypertension    NO MEDS , patient reports sometimes elevated    Pre-diabetes    Pt denies. A1c was 7.8 06/13/21.   Past Surgical History:  Procedure Laterality Date   KNEE ARTHROSCOPY Left 03/29/2020   Procedure: LEFT KNEE ARTHROSCOPY WITH DEBRIDEMENT AND PARTIAL MEDIAL MENISCECTOMY;  Surgeon: Mcarthur Rossetti, MD;  Location: [place];  Service: Orthopedics;  Laterality: Left;   KNEE ARTHROSCOPY WITH MEDIAL MENISECTOMY Right 10/17/2017   Procedure: RIGHT KNEE ARTHROSCOPY WITH PARTIAL MEDIAL MENISCECTOMY AND DEBRIDEMENT;  Surgeon: Mcarthur Rossetti, MD;  Location: [facility];  Service: Orthopedics;  Laterality: Right;   TOTAL KNEE ARTHROPLASTY Right 09/29/2021   Procedure: Right TOTAL KNEE ARTHROPLASTY;  Surgeon: Mcarthur Rossetti, MD;  Location: [facility];  Service: Orthopedics;  Laterality: Right;   Patient Active Problem List   Diagnosis Date Noted   Status post total knee replacement, right 10/01/2021   Status post total right knee replacement 09/29/2021   S/P revision of total knee, right 09/29/2021   Unilateral primary osteoarthritis, right knee 08/31/2020   Status post arthroscopy of left knee 04/05/2020   Other tear of medial meniscus, current injury, left knee, subsequent  encounter 03/29/2020   Status post arthroscopy of right knee 10/28/2017   Medial meniscus, posterior horn derangement, right 10/17/2017    REFERRING DIAG: Z96.651 (ICD-10-CM) - Status post total right knee replacement  THERAPY DIAG:  Other abnormalities of gait and mobility  Stiffness of right knee, not elsewhere classified  Acute pain of right knee  Localized edema  PERTINENT HISTORY: Left meniscus tear & repair, right mensicus tear & repair, OA, HTN, pre-daibetes  PRECAUTIONS: None  SUBJECTIVE:  Patient reports via interpretor that he has up and down days, sometimes has pain from knee to top of foot, so bad that cannot support his weight.  Other days ok.  It has been tough all throughout his body.  He is not able to work because of this pain.  The knee has not been hurting.   PAIN:  Are you having pain? Yes NPRS scale: 3/10 Pain location: plantar surface R foot   OBJECTIVE: (objective measures completed at initial evaluation unless otherwise dated)   PATIENT SURVEYS:  11/01/21 FOTO  76 (predicted 102)             EDEMA: RLE: above knee 43.6cm around knee 43cm below knee 36.8cm LLE: above knee 36.3cm around knee 36cm below knee 31.8cm   POSTURE:  Stands with weight shifted to left, right knee flexed with partial weight   PALPATION: Tenderness over right knee joint line, incision, quadriceps, hamstrings & gastroc. Scar mobility limited over patella  and proximal tibia.    LE ROM:   ROM P:passive  A:active Right 10/24/2021 Right 11/01/21 Right 5/11 Right 5/18 Right 5/25 12/19/21 Right 01/30/22  Knee flexion Supine P:  82* Seated A: 73* Supine: AA 97 Supine: AA 105 Standing AROM 110 AROM 110 110 117  Knee extension Supine P:    A: -12* LAQ   AROM -3  0 0   (Blank rows = not tested)   LE MMT:   MMT Right 10/24/2021 Right 11/23/21  Knee flexion 3-/5  5/5  Knee extension 3-/5  5/5   (Blank rows = not tested)   GAIT: Distance walked: 100' Assistive device  utilized: Environmental consultant - 2 wheeled Level of assistance: SBA Comments: antalgic, limited weight bearing RLE, right knee flexed in stance with minimal change in range for swing.       TODAY'S TREATMENT: 02/06/2022 Therapeutic Exercise: to improve strength and mobility. Bike L2 x 11 min  Hamstring stretch R 2 x 30 sec  Therapeutic Activities: to assess goals, concerns and pain.  Assessment of R foot for tenderness, mobility, and sensation.  Mobs to forefoot to improve mobility.  Self Care- education on medication, effects of uncontrolled blood sugar on nerves, especially to feet.  Recommendations on medications - please talk to pharmacist and make appointment with PCP.   01/30/22 Therapeutic Exercise: Bike L3x35mn Assessed LTGs Spoke with pt via interpreter about progress, return to work, financial assistance Complete FOTO - 70%  01/16/2022 Therapeutic Exercise: to improve strength and mobility.  Demo, verbal and tactile cues throughout for technique. Bike L2 x 8 min  Cybex leg extension 25# 2 x 10 cues for eccentric control Cybex leg flexion 25# 2 x 10  Cybex leg press 35# x 15 - L knee started to hurt Manual Therapy: to decrease muscle spasm and pain and improve mobility IASTM with s/s tools to R quadriceps, patellar tendon, patellar mobs.    12/19/2021 Therapeutic Activities: to assess progress towards goals.  Bike L2 x 8 min  Goals Education on normal healing and anatomy  Manual Therapy: to decrease muscle spasm and pain and improve mobility IASTM with s/s tools and STM to R rectus femoris and vastus lateralis.  Skilled palpation and monitoring with dyr needling.  Trigger Point Dry-Needling  Treatment instructions: Expect mild to moderate muscle soreness. S/S of pneumothorax if dry needled over a lung field, and to seek immediate medical attention should they occur. Patient verbalized understanding of these instructions and education.  Patient Consent Given: Yes Education handout  provided: Yes Muscles treated: R vastus lateralis Electrical stimulation performed: No Parameters: N/A Treatment response/outcome: Palpable Increase in Muscle Length  but very poorly tolerated, patient reported increase pain, stopped intervention.      PATIENT EDUCATION:    Access Code: [email or web address]  ASSESSMENT:   CLINICAL IMPRESSION: JAmario Longmorereports R foot pain limiting activity.  He was concerned that the foot pain (plantar surface) was connected to his knee replacement.  He describes neuropathic type pain - pain is bottom of foot and burning, then feels cold, worse at night.  He had not tenderness with palpation in his calf, heel or achilles tendon.  Did note decreased mobility in forefoot.  Noted taking metformin in chart and elevated AIC last taken 6 months ago was 6.6, which is in the diabetic range.  Discussed importance of controlling blood sugars to prevent nerve damage/neuropathy, at this point patient revealed he had stopped taking metformin before surgery, and had not  started taking it again afterwards, so it has been 5 months since he has taken this medication.  Recommended that he call and follow-up with his PCP, since uncontrolled blood sugar may be affecting his healing and this new complaint of foot pain.  He then asked about just restarting medication and would he need to refill, directed him to speak with pharmacist and PCP.  He has met all his goals for his R TKA, ROM is good from 0-117, and he is able to ambulate without AD.  We are placing him on 30 day hold with plan to discharge at end of this period if does not require further therapy related to his knee replacement.    OBJECTIVE IMPAIRMENTS Abnormal gait, decreased activity tolerance, decreased balance, decreased knowledge of condition, decreased knowledge of use of DME, decreased mobility, decreased ROM, decreased strength, increased edema, postural dysfunction, and pain.    ACTIVITY LIMITATIONS community  activity.    PERSONAL FACTORS 1-2 comorbidities: see PMH  are also affecting patient's functional outcome.      REHAB POTENTIAL: Good   CLINICAL DECISION MAKING: Stable/uncomplicated   EVALUATION COMPLEXITY: Low     GOALS: Goals reviewed with patient?    SHORT TERM GOALS: Target date: 11/17/2021   Patient independent and verbalizes compliance with initial HEP Baseline: SEE OBJECTIVE DATA Goal status: Met 11/03/2021   2.  Patient reports 50% improvement in right knee pain. Baseline: SEE OBJECTIVE DATA Goal status: Met (11/21/21 60% improvement)   3.  PROM right knee extension -3* to flexion 90* Baseline: SEE OBJECTIVE DATA Goal status: MET 3 to 110   LONG TERM GOALS: Target date: 12/15/2021    Patient will improve FOTO score to target% Baseline: SEE OBJECTIVE DATA Goal status: MET - 01/30/22   2.  Patient reports right knee pain </= 2/10 with standing & gait activities.  Baseline: SEE OBJECTIVE DATA Goal status: MET - 01/30/22 - no pain reported with standing & gait   3.  Right Knee PROM 0* extension to 100* flexion Baseline: SEE OBJECTIVE DATA Goal status: MET 12/19/21 R knee AROM 0 to 110* flexion   4.  Right Knee AROM seated -3* extension to 90* flexion Baseline: SEE OBJECTIVE DATA Goal status: MET R knee 3 to 105 seated   5.  Patient ambulates >500' community distances including negotiating ramps, curbs & stairs without device independently. Baseline: SEE OBJECTIVE DATA Goal status: MET     PLAN: PT FREQUENCY: continue 1x/week for 4 more visits.    PT DURATION: 4-6 weeks   PLANNED INTERVENTIONS: Therapeutic exercises, Therapeutic activity, Neuromuscular re-education, Balance training, Gait training, Patient/Family education, Joint mobilization, Stair training, DME instructions, Electrical stimulation, Cryotherapy, Moist heat, scar mobilization, Taping, Vasopneumatic device, and Manual therapy   PLAN FOR NEXT SESSION:  30 day hold    Rennie Natter, PT,  DPT  02/06/22 4:32 PM

## 2022-02-07 ENCOUNTER — Other Ambulatory Visit: Payer: Self-pay | Admitting: Orthopaedic Surgery

## 2022-02-07 ENCOUNTER — Telehealth: Payer: Self-pay | Admitting: Orthopaedic Surgery

## 2022-02-07 MED ORDER — HYDROCODONE-ACETAMINOPHEN 7.5-325 MG PO TABS: 1.0000 | ORAL_TABLET | Freq: Three times a day (TID) | ORAL | 0 refills | Status: DC | PRN

## 2022-02-07 NOTE — Telephone Encounter (Signed)
Patient called. He would like a refill on his pain medication. His call back number is [phone]

## 2022-02-20 ENCOUNTER — Telehealth: Payer: Self-pay | Admitting: Orthopaedic Surgery

## 2022-02-20 ENCOUNTER — Other Ambulatory Visit: Payer: Self-pay | Admitting: Orthopaedic Surgery

## 2022-02-20 MED ORDER — TIZANIDINE HCL 4 MG PO TABS: 4.0000 mg | ORAL_TABLET | Freq: Three times a day (TID) | ORAL | 1 refills | Status: DC | PRN

## 2022-02-20 MED ORDER — ACETAMINOPHEN-CODEINE 300-30 MG PO TABS: 1.0000 | ORAL_TABLET | Freq: Three times a day (TID) | ORAL | 0 refills | Status: DC | PRN

## 2022-02-20 NOTE — Telephone Encounter (Signed)
Patient aware of the below pain

## 2022-02-20 NOTE — Telephone Encounter (Signed)
Refill both medications please

## 2022-03-13 ENCOUNTER — Other Ambulatory Visit: Payer: Self-pay | Admitting: Orthopaedic Surgery

## 2022-03-13 ENCOUNTER — Telehealth: Payer: Self-pay | Admitting: Orthopaedic Surgery

## 2022-03-13 MED ORDER — ACETAMINOPHEN-CODEINE 300-30 MG PO TABS: 1.0000 | ORAL_TABLET | Freq: Three times a day (TID) | ORAL | 0 refills | Status: DC | PRN

## 2022-03-13 MED ORDER — TIZANIDINE HCL 4 MG PO TABS: 4.0000 mg | ORAL_TABLET | Freq: Three times a day (TID) | ORAL | 1 refills | Status: DC | PRN

## 2022-03-13 NOTE — Telephone Encounter (Signed)
Patient called. He would like a refill on (TYLENOL #3) and  Zanaflex.

## 2022-03-13 NOTE — Telephone Encounter (Signed)
Please advise 

## 2022-04-06 ENCOUNTER — Other Ambulatory Visit: Payer: Self-pay | Admitting: Orthopaedic Surgery

## 2022-04-06 ENCOUNTER — Telehealth: Payer: Self-pay | Admitting: Orthopaedic Surgery

## 2022-04-06 MED ORDER — ACETAMINOPHEN-CODEINE 300-30 MG PO TABS: 1.0000 | ORAL_TABLET | Freq: Three times a day (TID) | ORAL | 0 refills | Status: AC | PRN

## 2022-04-06 NOTE — Telephone Encounter (Signed)
Patient called. Says he needs a refill on Tylenol #3. His call back number is [phone]

## 2022-04-06 NOTE — Telephone Encounter (Signed)
Please advise 

## 2022-04-09 NOTE — Telephone Encounter (Signed)
Tried calling pt to advise but no answer

## 2022-04-19 ENCOUNTER — Encounter: Payer: Self-pay | Admitting: Orthopaedic Surgery

## 2022-04-19 ENCOUNTER — Ambulatory Visit (INDEPENDENT_AMBULATORY_CARE_PROVIDER_SITE_OTHER): Payer: Self-pay | Admitting: Orthopaedic Surgery

## 2022-04-19 ENCOUNTER — Ambulatory Visit (INDEPENDENT_AMBULATORY_CARE_PROVIDER_SITE_OTHER): Payer: Self-pay

## 2022-04-19 DIAGNOSIS — Z96651 Presence of right artificial knee joint: Secondary | ICD-10-CM

## 2022-04-19 MED ORDER — TIZANIDINE HCL 4 MG PO TABS: 4.0000 mg | ORAL_TABLET | Freq: Three times a day (TID) | ORAL | 1 refills | Status: AC | PRN

## 2022-04-19 NOTE — Progress Notes (Signed)
The patient is a 61 year old gentleman who is now 8 months status post a right press-fit total knee arthroplasty.  He has been on Zanaflex and Tylenol 3.  He still needs this on a daily basis but I have counseled him extensively today about decreasing the need for this and coming off of those medications because we need to get to the point of not providing it.  The knee is doing well otherwise.  He does have some pain on occasion with walking activities and increasing his activities and still some swelling to be expected.  On exam he has excellent range of motion of his right operative knee with mild swelling.  The knee is ligamentously stable.  2 views of the right knee show well-seated press-fit total knee arthroplasty with no complicating features.  From my standpoint the next time I need to see him back is not for 6 months unless there are issues.  We will have a final AP and lateral of his right knee at that visit.  He will work on weaning his pain medications.

## 2022-05-10 ENCOUNTER — Telehealth: Payer: Self-pay | Admitting: Orthopaedic Surgery

## 2022-05-10 NOTE — Telephone Encounter (Signed)
Please advise 

## 2022-05-10 NOTE — Telephone Encounter (Signed)
Patient called. He would like some pain medication called in for him. His call back number is [phone]

## 2022-05-11 NOTE — Telephone Encounter (Signed)
Lvm advising using the spanish interpreter service

## 2022-10-29 ENCOUNTER — Other Ambulatory Visit (INDEPENDENT_AMBULATORY_CARE_PROVIDER_SITE_OTHER): Payer: Self-pay

## 2022-10-29 ENCOUNTER — Ambulatory Visit (INDEPENDENT_AMBULATORY_CARE_PROVIDER_SITE_OTHER): Payer: Self-pay | Admitting: Orthopaedic Surgery

## 2022-10-29 ENCOUNTER — Encounter: Payer: Self-pay | Admitting: Orthopaedic Surgery

## 2022-10-29 DIAGNOSIS — Z96651 Presence of right artificial knee joint: Secondary | ICD-10-CM

## 2022-10-29 NOTE — Progress Notes (Signed)
The patient is now 13 months status post a right total knee arthroplasty.  He says it can be sore at times but he is really having some pain in the left knee.  His right operative knee has full range of motion.  There is no swelling or effusion.  It is ligamentously stable.  His left knee does have slight varus malalignment.  There is medial joint line tenderness and patellofemoral crepitation.  A standing AP and lateral of his right operative knee shows a well-seated press-fit total knee arthroplasty with good alignment and no effusion and no complicating features.  The left knee shows slight varus malalignment on the AP view.  I gave him reassurance that his right knee is doing well there is really nothing I would recommend for the right knee.  If the left knee gets bad enough he can consider knee replacement on that knee.  We can certainly see him back in 6 months to see how he is doing overall we do really only want a standing AP and lateral of the left knee if he still painful.  The right knee lateral longer needs x-rays unless there are issues.
